# Patient Record
Sex: Female | Born: 2000 | Race: Black or African American | Hispanic: No | Marital: Single | Smoking: Never smoker
Health system: Southern US, Community
[De-identification: ages and names within clinical notes are randomized; demographics above are authoritative.]

---
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
## 2022-04-10 ENCOUNTER — Encounter (HOSPITAL_COMMUNITY): Payer: Self-pay

## 2022-04-10 ENCOUNTER — Ambulatory Visit (INDEPENDENT_AMBULATORY_CARE_PROVIDER_SITE_OTHER): Payer: Medicaid Other

## 2022-04-10 ENCOUNTER — Ambulatory Visit (HOSPITAL_COMMUNITY)
Admission: EM | Admit: 2022-04-10 | Discharge: 2022-04-10 | Disposition: A | Discharge status: Home / Self Care | Payer: Medicaid Other | Attending: Physician Assistant | Admitting: Physician Assistant

## 2022-04-10 DIAGNOSIS — M79671 Pain in right foot: Secondary | ICD-10-CM | POA: Diagnosis not present

## 2022-04-10 DIAGNOSIS — M62838 Other muscle spasm: Secondary | ICD-10-CM

## 2022-04-10 MED ORDER — IBUPROFEN 600 MG PO TABS: 600.0000 mg | ORAL_TABLET | Freq: Four times a day (QID) | ORAL | 0 refills | Status: DC | PRN

## 2022-04-10 MED ORDER — CYCLOBENZAPRINE HCL 5 MG PO TABS: 5.0000 mg | ORAL_TABLET | Freq: Three times a day (TID) | ORAL | 0 refills | Status: DC | PRN
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
## 2022-04-10 NOTE — Discharge Instructions (Addendum)
Your xray was negative for fracture  Recommend ibuprofen as needed for pain Can take flexeril as needed for muscle spasm Recommend ice to affected areas and rest Drink plenty of fluids If you develop visual changes, nausea, vomiting go to the Emergency Department for evaluation.
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
## 2022-11-17 ENCOUNTER — Encounter: Payer: Self-pay | Admitting: Emergency Medicine

## 2022-11-17 ENCOUNTER — Other Ambulatory Visit: Payer: Self-pay

## 2022-11-17 ENCOUNTER — Ambulatory Visit
Admission: EM | Admit: 2022-11-17 | Discharge: 2022-11-17 | Disposition: A | Discharge status: Home / Self Care | Payer: Medicaid Other | Attending: Nurse Practitioner | Admitting: Nurse Practitioner

## 2022-11-17 DIAGNOSIS — N760 Acute vaginitis: Secondary | ICD-10-CM | POA: Diagnosis present

## 2022-11-17 DIAGNOSIS — R6889 Other general symptoms and signs: Secondary | ICD-10-CM | POA: Insufficient documentation

## 2022-11-17 LAB — POCT URINE PREGNANCY: Preg Test, Ur: NEGATIVE

## 2022-11-17 MED ORDER — METRONIDAZOLE 500 MG PO TABS: 500.0000 mg | ORAL_TABLET | Freq: Two times a day (BID) | ORAL | 0 refills | Status: DC

## 2022-11-17 MED ORDER — BENZONATATE 200 MG PO CAPS: 200.0000 mg | ORAL_CAPSULE | Freq: Three times a day (TID) | ORAL | 0 refills | Status: DC | PRN

## 2022-11-17 MED ORDER — ALBUTEROL SULFATE HFA 108 (90 BASE) MCG/ACT IN AERS: 1.0000 | INHALATION_SPRAY | Freq: Four times a day (QID) | RESPIRATORY_TRACT | 0 refills | Status: DC | PRN

## 2022-11-17 NOTE — ED Provider Notes (Signed)
[facility]    CSN: [id number] Arrival date & time: 11/17/22  1251      History   Chief Complaint Chief Complaint  Patient presents with   Cough   Exposure to STD    HPI Jody King is a 22 y.o. female who presents for evaluation of URI symptoms for 3 days. Patient reports associated symptoms of fever, nausea/vomiting, body aches, sore throat, cough/congestion, shortness of breath. Denies diarrhea, ear pain. Patient does not have a hx of asthma.  She reports she occasionally smokes.  States her friend was diagnosed with flu recently.  No recent travel. Pt is vaccinated for COVID. Pt is not vaccinated for flu this season. Pt has taken Tylenol and OTC cough medicine for symptoms. Pt has no other concerns at this time. In addition patient reports 2 to 3 days of a thick fishy smelling discharge.  Denies any itching or dysuria.  No flank pain.  Denies any known STD exposure but would like to get checked.  No other concerns at this time.    Cough Associated symptoms: fever, myalgias, shortness of breath and sore throat   Exposure to STD Associated symptoms include shortness of breath.    History reviewed. No pertinent past medical history.  There are no problems to display for this patient.   History reviewed. No pertinent surgical history.  OB History   No obstetric history on file.      Home Medications    Prior to Admission medications   Medication Sig Start Date End Date Taking? Authorizing Provider  albuterol (VENTOLIN HFA) 108 (90 Base) MCG/ACT inhaler Inhale 1-2 puffs into the lungs every 6 (six) hours as needed for wheezing or shortness of breath. 11/17/22  Yes Melynda Ripple, NP  benzonatate (TESSALON) 200 MG capsule Take 1 capsule (200 mg total) by mouth 3 (three) times daily as needed for cough. 11/17/22  Yes Melynda Ripple, NP  metroNIDAZOLE (FLAGYL) 500 MG tablet Take 1 tablet (500 mg total) by mouth 2 (two) times daily. 11/17/22  Yes Melynda Ripple, NP   cyclobenzaprine (FLEXERIL) 5 MG tablet Take 1 tablet (5 mg total) by mouth 3 (three) times daily as needed for muscle spasms. 04/10/22   Ward, Lenise Arena, PA-C  ibuprofen (ADVIL) 600 MG tablet Take 1 tablet (600 mg total) by mouth every 6 (six) hours as needed. 04/10/22   Ward, Lenise Arena, PA-C    Family History History reviewed. No pertinent family history.  Social History Social History   Tobacco Use   Smoking status: Never   Smokeless tobacco: Never  Substance Use Topics   Alcohol use: No   Drug use: Yes    Types: Marijuana     Allergies   Patient has no known allergies.   Review of Systems Review of Systems  Constitutional:  Positive for fever.  HENT:  Positive for congestion and sore throat.   Respiratory:  Positive for cough and shortness of breath.   Gastrointestinal:  Positive for nausea and vomiting.  Genitourinary:  Positive for vaginal discharge.  Musculoskeletal:  Positive for myalgias.     Physical Exam Triage Vital Signs ED Triage Vitals  Enc Vitals Group     BP 11/17/22 1314 129/89     Pulse Rate 11/17/22 1314 93     Resp 11/17/22 1314 18     Temp 11/17/22 1314 99.6 F (37.6 C)     Temp Source 11/17/22 1314 Oral     SpO2 11/17/22 1314 96 %  Weight --      Height --      Head Circumference --      Peak Flow --      Pain Score 11/17/22 1316 3     Pain Loc --      Pain Edu? --      Excl. in [place]? --    No data found.  Updated Vital Signs BP 129/89 (BP Location: Left Arm)   Pulse 93   Temp 99.6 F (37.6 C) (Oral)   Resp 18   SpO2 96%   Visual Acuity Right Eye Distance:   Left Eye Distance:   Bilateral Distance:    Right Eye Near:   Left Eye Near:    Bilateral Near:     Physical Exam Vitals and nursing note reviewed.  Constitutional:      General: She is not in acute distress.    Appearance: She is well-developed. She is not ill-appearing.  HENT:     Head: Normocephalic and atraumatic.     Right Ear: Tympanic membrane and ear  canal normal.     Left Ear: Tympanic membrane and ear canal normal.     Nose: Congestion present.     Mouth/Throat:     Mouth: Mucous membranes are moist.     Pharynx: Oropharynx is clear. Uvula midline. Posterior oropharyngeal erythema present.     Tonsils: No tonsillar exudate or tonsillar abscesses.  Eyes:     Conjunctiva/sclera: Conjunctivae normal.     Pupils: Pupils are equal, round, and reactive to light.  Cardiovascular:     Rate and Rhythm: Normal rate and regular rhythm.     Heart sounds: Normal heart sounds.  Pulmonary:     Effort: Pulmonary effort is normal.     Breath sounds: Normal breath sounds.  Musculoskeletal:     Cervical back: Normal range of motion and neck supple.  Lymphadenopathy:     Cervical: No cervical adenopathy.  Skin:    General: Skin is warm and dry.  Neurological:     General: No focal deficit present.     Mental Status: She is alert and oriented to person, place, and time.  Psychiatric:        Mood and Affect: Mood normal.        Behavior: Behavior normal.      UC Treatments / Results  Labs (all labs ordered are listed, but only abnormal results are displayed) Labs Reviewed  POCT URINE PREGNANCY  CERVICOVAGINAL ANCILLARY ONLY    EKG   Radiology No results found.  Procedures Procedures (including critical care time)  Medications Ordered in UC Medications - No data to display  Initial Impression / Assessment and Plan / UC Course  I have reviewed the triage vital signs and the nursing notes.  Pertinent labs & imaging results that were available during my care of the patient were reviewed by me and considered in my medical decision making (see chart for details).     Reviewed exam and symptoms with patient.  Discussed symptoms consistent with flu like illness.  Discussed symptomatic treatment given length of her symptoms Declined COVID testing Tessalon as needed Albuterol inhaler as needed STD testing is ordered.  Will start  Flagyl.  Side effect profile reviewed. Rest and fluids Follow-up with PCP 2 to 3 days for recheck ER precautions reviewed and patient verbalized understanding Final Clinical Impressions(s) / UC Diagnoses   Final diagnoses:  Acute vaginitis  Flu-like symptoms     Discharge Instructions  Albuterol inhaler and Tessalon as needed Flagyl twice daily for 7 days.  Do not drink any alcohol while you are taking this medication Rest and fluids Follow-up with your PCP 2 to 3 days for recheck Please go to the emergency room if you have any worsening symptoms   ED Prescriptions     Medication Sig Dispense Auth. Provider   albuterol (VENTOLIN HFA) 108 (90 Base) MCG/ACT inhaler Inhale 1-2 puffs into the lungs every 6 (six) hours as needed for wheezing or shortness of breath. 1 each Radford Pax, NP   benzonatate (TESSALON) 200 MG capsule Take 1 capsule (200 mg total) by mouth 3 (three) times daily as needed for cough. 20 capsule Radford Pax, NP   metroNIDAZOLE (FLAGYL) 500 MG tablet Take 1 tablet (500 mg total) by mouth 2 (two) times daily. 14 tablet Radford Pax, NP      PDMP not reviewed this encounter.   Radford Pax, NP 11/17/22 1351

## 2022-11-17 NOTE — ED Triage Notes (Signed)
Pt here for cough and congestion with nausea x 3 days; pt also requesting STD testing due to foul smell

## 2022-11-17 NOTE — Discharge Instructions (Signed)
Albuterol inhaler and Tessalon as needed Flagyl twice daily for 7 days.  Do not drink any alcohol while you are taking this medication Rest and fluids Follow-up with your PCP 2 to 3 days for recheck Please go to the emergency room if you have any worsening symptoms

## 2022-11-19 LAB — CERVICOVAGINAL ANCILLARY ONLY
Bacterial Vaginitis (gardnerella): POSITIVE — AB
Candida Glabrata: NEGATIVE
Candida Vaginitis: NEGATIVE
Chlamydia: NEGATIVE
Comment: NEGATIVE
Comment: NEGATIVE
Comment: NEGATIVE
Comment: NEGATIVE
Comment: NEGATIVE
Comment: NORMAL
Neisseria Gonorrhea: POSITIVE — AB
Trichomonas: NEGATIVE

## 2022-11-20 ENCOUNTER — Telehealth (HOSPITAL_COMMUNITY): Payer: Self-pay | Admitting: Emergency Medicine

## 2022-11-20 NOTE — Telephone Encounter (Signed)
Patient went home on MEtronidazole Per protocol, will also need treatment with IM Rocephin 500mg  for positive Gonrorhea

## 2022-12-11 ENCOUNTER — Ambulatory Visit
Admission: EM | Admit: 2022-12-11 | Discharge: 2022-12-11 | Disposition: A | Discharge status: Home / Self Care | Payer: Medicaid Other | Attending: Physician Assistant | Admitting: Physician Assistant

## 2022-12-11 ENCOUNTER — Telehealth: Payer: Self-pay | Admitting: *Deleted

## 2022-12-11 ENCOUNTER — Other Ambulatory Visit: Payer: Self-pay

## 2022-12-11 ENCOUNTER — Encounter: Payer: Self-pay | Admitting: *Deleted

## 2022-12-11 DIAGNOSIS — Z113 Encounter for screening for infections with a predominantly sexual mode of transmission: Secondary | ICD-10-CM | POA: Diagnosis not present

## 2022-12-11 MED ORDER — CEFTRIAXONE SODIUM 1 G IJ SOLR
1.0000 g | Freq: Once | INTRAMUSCULAR | Status: AC
  Administered 2022-12-11: 1 g via INTRAMUSCULAR

## 2022-12-11 NOTE — ED Triage Notes (Signed)
Pt received a call to come for treatment of STD.

## 2022-12-11 NOTE — ED Notes (Signed)
PT anxious unable to give IM rocephin

## 2023-01-23 ENCOUNTER — Ambulatory Visit
Admission: EM | Admit: 2023-01-23 | Discharge: 2023-01-23 | Disposition: A | Discharge status: Home / Self Care | Payer: Medicaid Other | Attending: Nurse Practitioner | Admitting: Nurse Practitioner

## 2023-01-23 DIAGNOSIS — B349 Viral infection, unspecified: Secondary | ICD-10-CM

## 2023-01-23 LAB — POCT INFLUENZA A/B
Influenza A, POC: NEGATIVE
Influenza B, POC: NEGATIVE

## 2023-01-23 MED ORDER — ALBUTEROL SULFATE HFA 108 (90 BASE) MCG/ACT IN AERS: 1.0000 | INHALATION_SPRAY | Freq: Four times a day (QID) | RESPIRATORY_TRACT | 0 refills | Status: DC | PRN

## 2023-01-23 MED ORDER — PROMETHAZINE-DM 6.25-15 MG/5ML PO SYRP: 5.0000 mL | ORAL_SOLUTION | Freq: Four times a day (QID) | ORAL | 0 refills | Status: DC | PRN

## 2023-01-23 MED ORDER — ONDANSETRON 4 MG PO TBDP: 4.0000 mg | ORAL_TABLET | Freq: Three times a day (TID) | ORAL | 0 refills | Status: DC | PRN

## 2023-01-23 NOTE — Discharge Instructions (Signed)
Albuterol as needed Promethazine as needed for cough.  This medication can make you drowsy.  Do not drink alcohol or drive on this medication Zofran as needed for nausea/vomiting Rest and fluids Follow-up with your PCP if your symptoms do not improve Please go to the ER for any worsening symptoms

## 2023-01-23 NOTE — ED Provider Notes (Signed)
[facility]    CSN: [id number] Arrival date & time: 01/23/23  1942      History   Chief Complaint Chief Complaint  Patient presents with   Nasal Congestion   Shortness of Breath   Sore Throat    HPI Jody King is a 22 y.o. female  presents for evaluation of URI symptoms for 5 days. Patient reports associated symptoms of cough, congestion, shortness of breath,sore throat and nausea and vomiting. Denies diarrhea, fevers. Patient does not have a hx of asthma or smoking. No known sick contacts.  Pt has taken Mucinex OTC for symptoms. Pt has no other concerns at this time.    Shortness of Breath Associated symptoms: cough, sore throat and vomiting   Sore Throat Associated symptoms include shortness of breath.    History reviewed. No pertinent past medical history.  There are no problems to display for this patient.   History reviewed. No pertinent surgical history.  OB History   No obstetric history on file.      Home Medications    Prior to Admission medications   Medication Sig Start Date End Date Taking? Authorizing Provider  albuterol (VENTOLIN HFA) 108 (90 Base) MCG/ACT inhaler Inhale 1-2 puffs into the lungs every 6 (six) hours as needed for wheezing or shortness of breath. 01/23/23  Yes Melynda Ripple, NP  ondansetron (ZOFRAN-ODT) 4 MG disintegrating tablet Take 1 tablet (4 mg total) by mouth every 8 (eight) hours as needed for nausea or vomiting. 01/23/23  Yes Melynda Ripple, NP  promethazine-dextromethorphan (PROMETHAZINE-DM) 6.25-15 MG/5ML syrup Take 5 mLs by mouth 4 (four) times daily as needed for cough. 01/23/23  Yes Melynda Ripple, NP  benzonatate (TESSALON) 200 MG capsule Take 1 capsule (200 mg total) by mouth 3 (three) times daily as needed for cough. 11/17/22   Melynda Ripple, NP  cyclobenzaprine (FLEXERIL) 5 MG tablet Take 1 tablet (5 mg total) by mouth 3 (three) times daily as needed for muscle spasms. 04/10/22   Ward, Lenise Arena, PA-C  ibuprofen  (ADVIL) 600 MG tablet Take 1 tablet (600 mg total) by mouth every 6 (six) hours as needed. 04/10/22   Ward, Lenise Arena, PA-C  metroNIDAZOLE (FLAGYL) 500 MG tablet Take 1 tablet (500 mg total) by mouth 2 (two) times daily. 11/17/22   Melynda Ripple, NP    Family History History reviewed. No pertinent family history.  Social History Social History   Tobacco Use   Smoking status: Never   Smokeless tobacco: Never  Substance Use Topics   Alcohol use: No   Drug use: Yes    Types: Marijuana     Allergies   Patient has no known allergies.   Review of Systems Review of Systems  HENT:  Positive for congestion and sore throat.   Respiratory:  Positive for cough and shortness of breath.   Gastrointestinal:  Positive for nausea and vomiting.     Physical Exam Triage Vital Signs ED Triage Vitals  Enc Vitals Group     BP 01/23/23 1951 125/84     Pulse Rate 01/23/23 1951 80     Resp 01/23/23 1951 18     Temp 01/23/23 1951 98.8 F (37.1 C)     Temp Source 01/23/23 1951 Oral     SpO2 01/23/23 1951 98 %     Weight --      Height --      Head Circumference --      Peak Flow --  Pain Score 01/23/23 1950 5     Pain Loc --      Pain Edu? --      Excl. in [place]? --    No data found.  Updated Vital Signs BP 125/84 (BP Location: Left Arm)   Pulse 80   Temp 98.8 F (37.1 C) (Oral)   Resp 18   LMP 01/02/2023 (Approximate)   SpO2 98%   Visual Acuity Right Eye Distance:   Left Eye Distance:   Bilateral Distance:    Right Eye Near:   Left Eye Near:    Bilateral Near:     Physical Exam Vitals and nursing note reviewed.  Constitutional:      General: She is not in acute distress.    Appearance: She is well-developed. She is not ill-appearing.  HENT:     Head: Normocephalic and atraumatic.     Right Ear: Tympanic membrane and ear canal normal.     Left Ear: Tympanic membrane and ear canal normal.     Nose: Congestion present.     Mouth/Throat:     Mouth: Mucous membranes  are moist.     Pharynx: Oropharynx is clear. Uvula midline. Posterior oropharyngeal erythema present.     Tonsils: No tonsillar exudate or tonsillar abscesses.  Eyes:     Conjunctiva/sclera: Conjunctivae normal.     Pupils: Pupils are equal, round, and reactive to light.  Cardiovascular:     Rate and Rhythm: Normal rate and regular rhythm.     Heart sounds: Normal heart sounds.  Pulmonary:     Effort: Pulmonary effort is normal.     Breath sounds: Normal breath sounds.  Musculoskeletal:     Cervical back: Normal range of motion and neck supple.  Lymphadenopathy:     Cervical: No cervical adenopathy.  Skin:    General: Skin is warm and dry.  Neurological:     General: No focal deficit present.     Mental Status: She is alert and oriented to person, place, and time.  Psychiatric:        Mood and Affect: Mood normal.        Behavior: Behavior normal.      UC Treatments / Results  Labs (all labs ordered are listed, but only abnormal results are displayed) Labs Reviewed  POCT INFLUENZA A/B    EKG   Radiology No results found.  Procedures Procedures (including critical care time)  Medications Ordered in UC Medications - No data to display  Initial Impression / Assessment and Plan / UC Course  I have reviewed the triage vital signs and the nursing notes.  Pertinent labs & imaging results that were available during my care of the patient were reviewed by me and considered in my medical decision making (see chart for details).     Reviewed exam and symptoms with patient.  No red flags. Negative rapid flu, discussed viral illness Promethazine DM for cough Albuterol inhaler as needed Zofran as needed for nausea/vomiting Rest and fluids PCP follow-up if symptoms do not precautions reviewed and patient verbalized understanding Final Clinical Impressions(s) / UC Diagnoses   Final diagnoses:  Viral illness     Discharge Instructions      Albuterol as  needed Promethazine as needed for cough.  This medication can make you drowsy.  Do not drink alcohol or drive on this medication Zofran as needed for nausea/vomiting Rest and fluids Follow-up with your PCP if your symptoms do not improve Please go to the ER  for any worsening symptoms     ED Prescriptions     Medication Sig Dispense Auth. Provider   albuterol (VENTOLIN HFA) 108 (90 Base) MCG/ACT inhaler Inhale 1-2 puffs into the lungs every 6 (six) hours as needed for wheezing or shortness of breath. 1 each Melynda Ripple, NP   promethazine-dextromethorphan (PROMETHAZINE-DM) 6.25-15 MG/5ML syrup Take 5 mLs by mouth 4 (four) times daily as needed for cough. 118 mL Melynda Ripple, NP   ondansetron (ZOFRAN-ODT) 4 MG disintegrating tablet Take 1 tablet (4 mg total) by mouth every 8 (eight) hours as needed for nausea or vomiting. 20 tablet Melynda Ripple, NP      PDMP not reviewed this encounter.   Melynda Ripple, NP 01/23/23 2011

## 2023-01-23 NOTE — ED Triage Notes (Signed)
Pt reports having SHOB, scratchy throat, runny nose, and nausea/ vomiting.   Started: 5 days ago   Home interventions:  OTC medication

## 2023-02-19 ENCOUNTER — Ambulatory Visit (HOSPITAL_COMMUNITY)
Admission: EM | Admit: 2023-02-19 | Discharge: 2023-02-19 | Disposition: A | Discharge status: Home / Self Care | Payer: Medicaid Other | Attending: Physician Assistant | Admitting: Physician Assistant

## 2023-02-19 ENCOUNTER — Encounter (HOSPITAL_COMMUNITY): Payer: Self-pay

## 2023-02-19 DIAGNOSIS — J069 Acute upper respiratory infection, unspecified: Secondary | ICD-10-CM | POA: Diagnosis not present

## 2023-02-19 DIAGNOSIS — M62838 Other muscle spasm: Secondary | ICD-10-CM | POA: Diagnosis not present

## 2023-02-19 DIAGNOSIS — B349 Viral infection, unspecified: Secondary | ICD-10-CM

## 2023-02-19 MED ORDER — PROMETHAZINE-DM 6.25-15 MG/5ML PO SYRP: 5.0000 mL | ORAL_SOLUTION | Freq: Four times a day (QID) | ORAL | 0 refills | Status: DC | PRN

## 2023-02-19 MED ORDER — CYCLOBENZAPRINE HCL 5 MG PO TABS: 5.0000 mg | ORAL_TABLET | Freq: Three times a day (TID) | ORAL | 0 refills | Status: DC | PRN

## 2023-02-19 MED ORDER — ALBUTEROL SULFATE HFA 108 (90 BASE) MCG/ACT IN AERS: 1.0000 | INHALATION_SPRAY | Freq: Four times a day (QID) | RESPIRATORY_TRACT | 0 refills | Status: DC | PRN
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
## 2023-02-19 NOTE — Discharge Instructions (Signed)
Recommend Mucinex and Flonase Take cough syrup as needed Use inhaler as needed for wheezing or shortness of breath  Can take muscle relaxer as needed for muscle spasm
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
## 2023-02-25 ENCOUNTER — Ambulatory Visit
Admission: EM | Admit: 2023-02-25 | Discharge: 2023-02-25 | Disposition: A | Discharge status: Home / Self Care | Payer: Medicaid Other | Attending: Nurse Practitioner | Admitting: Nurse Practitioner

## 2023-02-25 DIAGNOSIS — J209 Acute bronchitis, unspecified: Secondary | ICD-10-CM | POA: Diagnosis not present

## 2023-02-25 DIAGNOSIS — G43809 Other migraine, not intractable, without status migrainosus: Secondary | ICD-10-CM

## 2023-02-25 MED ORDER — BENZONATATE 200 MG PO CAPS: 200.0000 mg | ORAL_CAPSULE | Freq: Three times a day (TID) | ORAL | 0 refills | Status: DC | PRN

## 2023-02-25 MED ORDER — AZITHROMYCIN 250 MG PO TABS: 250.0000 mg | ORAL_TABLET | Freq: Every day | ORAL | 0 refills | Status: DC

## 2023-02-25 MED ORDER — IBUPROFEN 800 MG PO TABS: 800.0000 mg | ORAL_TABLET | Freq: Three times a day (TID) | ORAL | 0 refills | Status: AC | PRN

## 2023-02-25 MED ORDER — ALBUTEROL SULFATE HFA 108 (90 BASE) MCG/ACT IN AERS: 1.0000 | INHALATION_SPRAY | Freq: Four times a day (QID) | RESPIRATORY_TRACT | 0 refills | Status: AC | PRN

## 2023-02-25 NOTE — ED Triage Notes (Signed)
No answer by phone or in waiting area.

## 2023-02-25 NOTE — ED Triage Notes (Signed)
Pt c/o productive cough, nasal congestion, sore throat, and headache since last Thursday. States taking OTC meds with no relief. States took advil today.

## 2023-02-25 NOTE — Discharge Instructions (Signed)
Albuterol inhaler as needed.  Start Zithromax as prescribed Tessalon as needed for cough Ibuprofen 800 mg tablets as needed for your migraines Rest and fluids Please establish with a PCP for recheck as well as further treatment options of your migraines Please go to the ER for any worsening symptoms I hope you feel better soon!

## 2023-02-25 NOTE — ED Provider Notes (Signed)
[facility]    CSN: [id number] Arrival date & time: 02/25/23  1709      History   Chief Complaint Chief Complaint  Patient presents with   Cough   Headache    HPI Jody King is a 22 y.o. female  presents for evaluation of URI symptoms for 10 days. Patient reports associated symptoms of cough, congestion sore throat, and migraine headache.  Had a couple episodes of posttussive vomiting.  Denies diarrhea, fevers, ear pain, body aches, shortness of breath.  Has history of migraines.  Does not take any rescue medications.  Has been taking ibuprofen which temporarily helps.  Denies he is aware 6 of her life.  Patient does not have a hx of asthma.  She does smoke.  No known sick contacts.  Pt has taken the Mucinex and ibuprofen OTC for symptoms. Pt has no other concerns at this time.    Cough Associated symptoms: headaches and sore throat   Headache Associated symptoms: congestion, cough and sore throat     History reviewed. No pertinent past medical history.  There are no problems to display for this patient.   History reviewed. No pertinent surgical history.  OB History   No obstetric history on file.      Home Medications    Prior to Admission medications   Medication Sig Start Date End Date Taking? Authorizing Provider  albuterol (VENTOLIN HFA) 108 (90 Base) MCG/ACT inhaler Inhale 1-2 puffs into the lungs every 6 (six) hours as needed for wheezing or shortness of breath. 02/25/23  Yes Radford Pax, NP  azithromycin (ZITHROMAX) 250 MG tablet Take 1 tablet (250 mg total) by mouth daily. Take first 2 tablets together, then 1 every day until finished. 02/25/23  Yes Radford Pax, NP  benzonatate (TESSALON) 200 MG capsule Take 1 capsule (200 mg total) by mouth 3 (three) times daily as needed for cough. 02/25/23  Yes Radford Pax, NP  ibuprofen (ADVIL) 800 MG tablet Take 1 tablet (800 mg total) by mouth every 8 (eight) hours as needed for headache. 02/25/23  Yes  Radford Pax, NP  promethazine-dextromethorphan (PROMETHAZINE-DM) 6.25-15 MG/5ML syrup Take 5 mLs by mouth 4 (four) times daily as needed for cough. 02/19/23   Ward, Tylene Fantasia, PA-C    Family History History reviewed. No pertinent family history.  Social History Social History   Tobacco Use   Smoking status: Never   Smokeless tobacco: Never  Substance Use Topics   Alcohol use: No   Drug use: Yes    Types: Marijuana     Allergies   Patient has no known allergies.   Review of Systems Review of Systems  HENT:  Positive for congestion and sore throat.   Respiratory:  Positive for cough.   Gastrointestinal:        Posttussive vomiting  Neurological:  Positive for headaches.     Physical Exam Triage Vital Signs ED Triage Vitals  Enc Vitals Group     BP 02/25/23 1848 122/65     Pulse Rate 02/25/23 1848 69     Resp 02/25/23 1848 18     Temp 02/25/23 1848 99.2 F (37.3 C)     Temp Source 02/25/23 1848 Oral     SpO2 02/25/23 1848 98 %     Weight --      Height --      Head Circumference --      Peak Flow --      Pain Score  02/25/23 1842 7     Pain Loc --      Pain Edu? --      Excl. in GC? --    No data found.  Updated Vital Signs BP 122/65 (BP Location: Left Arm)   Pulse 69   Temp 99.2 F (37.3 C) (Oral)   Resp 18   LMP 01/25/2023 (Approximate)   SpO2 98%   Visual Acuity Right Eye Distance:   Left Eye Distance:   Bilateral Distance:    Right Eye Near:   Left Eye Near:    Bilateral Near:     Physical Exam Vitals and nursing note reviewed.  Constitutional:      General: She is not in acute distress.    Appearance: She is well-developed. She is not ill-appearing.  HENT:     Head: Normocephalic and atraumatic.     Right Ear: Tympanic membrane and ear canal normal.     Left Ear: Tympanic membrane and ear canal normal.     Nose: Congestion present.     Mouth/Throat:     Mouth: Mucous membranes are moist.     Pharynx: Oropharynx is clear. Uvula  midline. No oropharyngeal exudate or posterior oropharyngeal erythema.     Tonsils: No tonsillar exudate or tonsillar abscesses.  Eyes:     Conjunctiva/sclera: Conjunctivae normal.     Pupils: Pupils are equal, round, and reactive to light.  Cardiovascular:     Rate and Rhythm: Normal rate and regular rhythm.     Heart sounds: Normal heart sounds.  Pulmonary:     Effort: Pulmonary effort is normal.     Breath sounds: Normal breath sounds.  Musculoskeletal:     Cervical back: Normal range of motion and neck supple.  Lymphadenopathy:     Cervical: No cervical adenopathy.  Skin:    General: Skin is warm and dry.  Neurological:     General: No focal deficit present.     Mental Status: She is alert and oriented to person, place, and time.     GCS: GCS eye subscore is 4. GCS verbal subscore is 5. GCS motor subscore is 6.     Cranial Nerves: No facial asymmetry.     Motor: No weakness.     Coordination: Romberg sign negative. Finger-Nose-Finger Test normal.  Psychiatric:        Mood and Affect: Mood normal.        Behavior: Behavior normal.      UC Treatments / Results  Labs (all labs ordered are listed, but only abnormal results are displayed) Labs Reviewed - No data to display  EKG   Radiology No results found.  Procedures Procedures (including critical care time)  Medications Ordered in UC Medications - No data to display  Initial Impression / Assessment and Plan / UC Course  I have reviewed the triage vital signs and the nursing notes.  Pertinent labs & imaging results that were available during my care of the patient were reviewed by me and considered in my medical decision making (see chart for details).     Reviewed exam and symptoms with patient.  No red flags.  Patient is well-appearing and in no acute distress Will start Zithromax given length of symptoms Tessalon as needed for cough Albuterol inhaler as needed Ibuprofen as needed for migraines. Advised  PCP for recheck as well as for migraine management and overall wellness ER precautions reviewed and patient verbalized understanding Final Clinical Impressions(s) / UC Diagnoses   Final  diagnoses:  Acute bronchitis, unspecified organism  Other migraine without status migrainosus, not intractable     Discharge Instructions      Albuterol inhaler as needed.  Start Zithromax as prescribed Tessalon as needed for cough Ibuprofen 800 mg tablets as needed for your migraines Rest and fluids Please establish with a PCP for recheck as well as further treatment options of your migraines Please go to the ER for any worsening symptoms I hope you feel better soon!    ED Prescriptions     Medication Sig Dispense Auth. Provider   albuterol (VENTOLIN HFA) 108 (90 Base) MCG/ACT inhaler Inhale 1-2 puffs into the lungs every 6 (six) hours as needed for wheezing or shortness of breath. 1 each Radford Pax, NP   azithromycin (ZITHROMAX) 250 MG tablet Take 1 tablet (250 mg total) by mouth daily. Take first 2 tablets together, then 1 every day until finished. 6 tablet Radford Pax, NP   benzonatate (TESSALON) 200 MG capsule Take 1 capsule (200 mg total) by mouth 3 (three) times daily as needed for cough. 20 capsule Radford Pax, NP   ibuprofen (ADVIL) 800 MG tablet Take 1 tablet (800 mg total) by mouth every 8 (eight) hours as needed for headache. 21 tablet Radford Pax, NP      PDMP not reviewed this encounter.   Radford Pax, NP 02/25/23 1900

## 2023-04-07 ENCOUNTER — Ambulatory Visit (HOSPITAL_COMMUNITY)
Admission: EM | Admit: 2023-04-07 | Discharge: 2023-04-07 | Disposition: A | Discharge status: Home / Self Care | Payer: Medicaid Other | Attending: Emergency Medicine | Admitting: Emergency Medicine

## 2023-04-07 ENCOUNTER — Encounter (HOSPITAL_COMMUNITY): Payer: Self-pay | Admitting: Emergency Medicine

## 2023-04-07 DIAGNOSIS — J209 Acute bronchitis, unspecified: Secondary | ICD-10-CM | POA: Insufficient documentation

## 2023-04-07 DIAGNOSIS — Z202 Contact with and (suspected) exposure to infections with a predominantly sexual mode of transmission: Secondary | ICD-10-CM | POA: Diagnosis not present

## 2023-04-07 DIAGNOSIS — R051 Acute cough: Secondary | ICD-10-CM | POA: Insufficient documentation

## 2023-04-07 DIAGNOSIS — Z113 Encounter for screening for infections with a predominantly sexual mode of transmission: Secondary | ICD-10-CM | POA: Diagnosis not present

## 2023-04-07 MED ORDER — PROMETHAZINE-DM 6.25-15 MG/5ML PO SYRP: 5.0000 mL | ORAL_SOLUTION | Freq: Four times a day (QID) | ORAL | 0 refills | Status: DC | PRN

## 2023-04-07 MED ORDER — DOXYCYCLINE HYCLATE 100 MG PO CAPS: 100.0000 mg | ORAL_CAPSULE | Freq: Two times a day (BID) | ORAL | 0 refills | Status: DC

## 2023-04-07 MED ORDER — BENZONATATE 200 MG PO CAPS: 200.0000 mg | ORAL_CAPSULE | Freq: Three times a day (TID) | ORAL | 0 refills | Status: DC | PRN

## 2023-04-07 NOTE — ED Provider Notes (Signed)
[facility]    CSN: [id number] Arrival date & time: 04/07/23  1744      History   Chief Complaint Chief Complaint  Patient presents with   Cough   Exposure to STD    HPI Jody King is a 22 y.o. female.   productive cough worse in the morning, sob and wheezing only in the mornin with coughing, g, for 5 days, chest and back soreness, relates to coughing, no sick eating, mucinex, no repsiratory history, seasoanl allergies, abntihistmain, no constisnt,     Expoure to chlymadia, std, lmp 03/31/2023 only partner, someitmes condomn sue,   History reviewed. No pertinent past medical history.  There are no problems to display for this patient.   History reviewed. No pertinent surgical history.  OB History   No obstetric history on file.      Home Medications    Prior to Admission medications   Medication Sig Start Date End Date Taking? Authorizing Provider  albuterol (VENTOLIN HFA) 108 (90 Base) MCG/ACT inhaler Inhale 1-2 puffs into the lungs every 6 (six) hours as needed for wheezing or shortness of breath. 02/25/23   Radford Pax, NP  azithromycin (ZITHROMAX) 250 MG tablet Take 1 tablet (250 mg total) by mouth daily. Take first 2 tablets together, then 1 every day until finished. 02/25/23   Radford Pax, NP  benzonatate (TESSALON) 200 MG capsule Take 1 capsule (200 mg total) by mouth 3 (three) times daily as needed for cough. 02/25/23   Radford Pax, NP  ibuprofen (ADVIL) 800 MG tablet Take 1 tablet (800 mg total) by mouth every 8 (eight) hours as needed for headache. 02/25/23   Radford Pax, NP  promethazine-dextromethorphan (PROMETHAZINE-DM) 6.25-15 MG/5ML syrup Take 5 mLs by mouth 4 (four) times daily as needed for cough. 02/19/23   Ward, Tylene Fantasia, PA-C    Family History No family history on file.  Social History Social History   Tobacco Use   Smoking status: Never   Smokeless tobacco: Never  Substance Use Topics   Alcohol use: No   Drug use: Yes     Types: Marijuana     Allergies   Patient has no known allergies.   Review of Systems Review of Systems  Constitutional: Negative.   HENT:  Positive for congestion and rhinorrhea.   Respiratory:  Positive for cough, shortness of breath and wheezing. Negative for apnea, choking, chest tightness and stridor.   Cardiovascular: Negative.      Physical Exam Triage Vital Signs ED Triage Vitals  Enc Vitals Group     BP 04/07/23 1812 103/69     Pulse Rate 04/07/23 1812 71     Resp 04/07/23 1812 16     Temp 04/07/23 1812 98.7 F (37.1 C)     Temp Source 04/07/23 1812 Oral     SpO2 04/07/23 1812 97 %     Weight --      Height --      Head Circumference --      Peak Flow --      Pain Score 04/07/23 1810 0     Pain Loc --      Pain Edu? --      Excl. in GC? --    No data found.  Updated Vital Signs BP 103/69 (BP Location: Left Arm)   Pulse 71   Temp 98.7 F (37.1 C) (Oral)   Resp 16   LMP 03/31/2023   SpO2 97%   Visual  Acuity Right Eye Distance:   Left Eye Distance:   Bilateral Distance:    Right Eye Near:   Left Eye Near:    Bilateral Near:     Physical Exam Constitutional:      Appearance: Normal appearance.  HENT:     Right Ear: Tympanic membrane, ear canal and external ear normal.     Left Ear: Tympanic membrane, ear canal and external ear normal.     Nose: Congestion present. No rhinorrhea.     Mouth/Throat:     Pharynx: No posterior oropharyngeal erythema.  Eyes:     Extraocular Movements: Extraocular movements intact.  Cardiovascular:     Rate and Rhythm: Normal rate and regular rhythm.     Pulses: Normal pulses.     Heart sounds: Normal heart sounds.  Pulmonary:     Effort: Pulmonary effort is normal.     Breath sounds: Normal breath sounds.  Skin:    General: Skin is warm and dry.  Neurological:     Mental Status: She is alert and oriented to person, place, and time. Mental status is at baseline.      UC Treatments / Results  Labs (all  labs ordered are listed, but only abnormal results are displayed) Labs Reviewed  CYTOLOGY, (ORAL, ANAL, URETHRAL) ANCILLARY ONLY    EKG   Radiology No results found.  Procedures Procedures (including critical care time)  Medications Ordered in UC Medications - No data to display  Initial Impression / Assessment and Plan / UC Course  I have reviewed the triage vital signs and the nursing notes.  Pertinent labs & imaging results that were available during my care of the patient were reviewed by me and considered in my medical decision making (see chart for details).   Final Clinical Impressions(s) / UC Diagnoses   Final diagnoses:  Acute cough  Routine screening for STI (sexually transmitted infection)  Exposure to STD     Discharge Instructions      Your symptoms today are most likely being caused by a virus and should steadily improve in time   You may use cough syrup every 6 hours as needed for additional comfort, be mindful of this may make you feel sleepy    You can take Tylenol and/or Ibuprofen as needed for fever reduction and pain relief.   For cough: honey 1/2 to 1 teaspoon (you can dilute the honey in water or another fluid).  You can also use guaifenesin and dextromethorphan for cough. You can use a humidifier for chest congestion and cough.  If you don't have a humidifier, you can sit in the bathroom with the hot shower running.      For sore throat: try warm salt water gargles, cepacol lozenges, throat spray, warm tea or water with lemon/honey, popsicles or ice, or OTC cold relief medicine for throat discomfort.   For congestion: take a daily anti-histamine like Zyrtec, Claritin, and a oral decongestant, such as pseudoephedrine.  You can also use Flonase 1-2 sprays in each nostril daily.   It is important to stay hydrated: drink plenty of fluids (water, gatorade/powerade/pedialyte, juices, or teas) to keep your throat moisturized and help further relieve  irritation/discomfort.  Today you are being treated prophylactically for chlamydia  Take doxycycline every morning and every evening for 7 days, this medicine also provides coverage for any germs in the respiratory system and may help with your cough   Labs pending 2-3 days, you will be contacted if positive for  any sti and treatment will be sent to the pharmacy, you will have to return to the clinic if positive for gonorrhea to receive treatment   Please refrain from having sex until labs results, if positive please refrain from having sex until treatment complete and symptoms resolve   If positive for, Chlamydia  gonorrhea or trichomoniasis please notify partner or partners so they may tested as well  Moving forward, it is recommended you use some form of protection against the transmission of sti infections  such as condoms or dental dams with each sexual encounter     ED Prescriptions   None    PDMP not reviewed this encounter.

## 2023-04-07 NOTE — Discharge Instructions (Addendum)
Your symptoms today are most likely being caused by a virus as allergies  The antibiotic that you were given to treat chlamydia will also provide coverage for the airways  You may use cough syrup every 6 hours as needed for additional comfort, be mindful of this may make you feel sleepy  May use Tessalon pill every 8 hours as needed to help with coughing    You can take Tylenol and/or Ibuprofen as needed for fever reduction and pain relief.   For cough: honey 1/2 to 1 teaspoon (you can dilute the honey in water or another fluid).  You can also use guaifenesin and dextromethorphan for cough. You can use a humidifier for chest congestion and cough.  If you don't have a humidifier, you can sit in the bathroom with the hot shower running.      For sore throat: try warm salt water gargles, cepacol lozenges, throat spray, warm tea or water with lemon/honey, popsicles or ice, or OTC cold relief medicine for throat discomfort.   For congestion: take a daily anti-histamine like Zyrtec, Claritin, and a oral decongestant, such as pseudoephedrine.  You can also use Flonase 1-2 sprays in each nostril daily.   It is important to stay hydrated: drink plenty of fluids (water, gatorade/powerade/pedialyte, juices, or teas) to keep your throat moisturized and help further relieve irritation/discomfort.  Today you are being treated prophylactically for chlamydia  Take doxycycline every morning and every evening for 7 days, this medicine also provides coverage for any germs in the respiratory system and may help with your cough   Labs pending 2-3 days, you will be contacted if positive for any sti and treatment will be sent to the pharmacy, you will have to return to the clinic if positive for gonorrhea to receive treatment   Please refrain from having sex until labs results, if positive please refrain from having sex until treatment complete and symptoms resolve   If positive for, Chlamydia  gonorrhea or  trichomoniasis please notify partner or partners so they may tested as well  Moving forward, it is recommended you use some form of protection against the transmission of sti infections  such as condoms or dental dams with each sexual encounter

## 2023-04-07 NOTE — ED Triage Notes (Signed)
Had cough at night for about 5 days. Reports that in mornings coughs up phlegm. Took Mucinex.  Reports exposure to chlamydia and requesting STD testing.

## 2023-04-09 LAB — CYTOLOGY, (ORAL, ANAL, URETHRAL) ANCILLARY ONLY
Chlamydia: NEGATIVE
Comment: NEGATIVE
Comment: NEGATIVE
Comment: NORMAL
Neisseria Gonorrhea: NEGATIVE
Trichomonas: NEGATIVE

## 2023-04-27 ENCOUNTER — Other Ambulatory Visit: Payer: Self-pay

## 2023-04-27 ENCOUNTER — Emergency Department (HOSPITAL_COMMUNITY)
Admission: EM | Admit: 2023-04-27 | Discharge: 2023-04-28 | Disposition: A | Discharge status: Home / Self Care | Payer: Medicaid Other | Attending: Emergency Medicine | Admitting: Emergency Medicine

## 2023-04-27 ENCOUNTER — Encounter (HOSPITAL_COMMUNITY): Payer: Self-pay | Admitting: Emergency Medicine

## 2023-04-27 DIAGNOSIS — W540XXA Bitten by dog, initial encounter: Secondary | ICD-10-CM | POA: Insufficient documentation

## 2023-04-27 DIAGNOSIS — S61511A Laceration without foreign body of right wrist, initial encounter: Secondary | ICD-10-CM | POA: Insufficient documentation

## 2023-04-27 DIAGNOSIS — Y9301 Activity, walking, marching and hiking: Secondary | ICD-10-CM | POA: Diagnosis not present

## 2023-04-27 DIAGNOSIS — Y92039 Unspecified place in apartment as the place of occurrence of the external cause: Secondary | ICD-10-CM | POA: Diagnosis not present

## 2023-04-27 MED ORDER — TETANUS-DIPHTH-ACELL PERTUSSIS 5-2.5-18.5 LF-MCG/0.5 IM SUSY
0.5000 mL | PREFILLED_SYRINGE | Freq: Once | INTRAMUSCULAR | Status: DC
  Filled 2023-04-27: qty 0.5

## 2023-04-27 MED ORDER — OXYCODONE-ACETAMINOPHEN 5-325 MG PO TABS
2.0000 | ORAL_TABLET | Freq: Once | ORAL | Status: AC
  Administered 2023-04-28: 2 via ORAL
  Filled 2023-04-27: qty 2

## 2023-04-27 MED ORDER — BUPIVACAINE HCL (PF) 0.5 % IJ SOLN
10.0000 mL | Freq: Once | INTRAMUSCULAR | Status: AC
  Administered 2023-04-28: 10 mL
  Filled 2023-04-27: qty 10

## 2023-04-27 NOTE — ED Triage Notes (Signed)
Pt in with dog bite to R inner wrist, multiple teeth marks and inner tissue exposed. Bleeding controlled with gauze. Pt states it was a family friend's pitbull that ran towards her and attacked her, unknown vaccination status of dog. Unknown last tetanus, full ROM to wrist, limited ROM to R 4th and 5th fingers

## 2023-04-28 ENCOUNTER — Other Ambulatory Visit (HOSPITAL_COMMUNITY): Payer: Medicaid Other

## 2023-04-28 ENCOUNTER — Emergency Department (HOSPITAL_COMMUNITY): Payer: Medicaid Other

## 2023-04-28 MED ORDER — AMOXICILLIN-POT CLAVULANATE 875-125 MG PO TABS: 1.0000 | ORAL_TABLET | Freq: Two times a day (BID) | ORAL | 0 refills | Status: DC

## 2023-04-28 NOTE — ED Notes (Signed)
Wound dressing applied.

## 2023-04-28 NOTE — Discharge Instructions (Addendum)
Keep the area clean with warm soap and water dab dry and cover with a thin layer bacitracin ointment or Vaseline.  Keep covered prevent it from getting dirty.  Clean at least once per day and you may do so twice daily.  Follow-up with the hand surgeon I have given you their information.  He did have a laceration/puncture that penetrated down to the tendon in your forearm however it does not appear that it was injured.  I have written you prescription for an antibiotic called Augmentin take for the entire course as this will help prevent infections related to the dog bite.  Please use Tylenol or ibuprofen for pain.  You may use 600 mg ibuprofen every 6 hours or 1000 mg of Tylenol every 6 hours.  You may choose to alternate between the 2.  This would be most effective.  Not to exceed 4 g of Tylenol within 24 hours.  Not to exceed 3200 mg ibuprofen 24 hours.

## 2023-04-28 NOTE — ED Provider Notes (Addendum)
[facility]GENCY DEPARTMENT AT [facility] Provider Note   CSN: [id number] Arrival date & time: 04/27/23  2313     History  Chief Complaint  Patient presents with   Animal Bite    Kabao Glazer is a 22 y.o. female.   Animal Bite  Patient is a 22 year old female with no pertinent past medical history presenting emergency room today with complaints of dog bite to wrist/forearm.  Patient was walking to her mother's apartment complex when a dog that lives in her mother's complex he was wearing a collar and is known by the mother ran out and bit the daughter on the wrist. Daughter states she had not med this dog before and it seems it'd escaped from it's owners house.   Bleeding controlled w pressure. She has some paresthesias but can feel touch easily in finger tips. She has pain with movement of her 4th and 5th finger.      Home Medications Prior to Admission medications   Medication Sig Start Date End Date Taking? Authorizing Provider  albuterol (VENTOLIN HFA) 108 (90 Base) MCG/ACT inhaler Inhale 1-2 puffs into the lungs every 6 (six) hours as needed for wheezing or shortness of breath. 02/25/23   Radford Pax, NP  azithromycin (ZITHROMAX) 250 MG tablet Take 1 tablet (250 mg total) by mouth daily. Take first 2 tablets together, then 1 every day until finished. 02/25/23   Radford Pax, NP  benzonatate (TESSALON) 200 MG capsule Take 1 capsule (200 mg total) by mouth 3 (three) times daily as needed for cough. 04/07/23   White, Elita Boone, NP  doxycycline (VIBRAMYCIN) 100 MG capsule Take 1 capsule (100 mg total) by mouth 2 (two) times daily. 04/07/23   Valinda Hoar, NP  ibuprofen (ADVIL) 800 MG tablet Take 1 tablet (800 mg total) by mouth every 8 (eight) hours as needed for headache. 02/25/23   Radford Pax, NP  promethazine-dextromethorphan (PROMETHAZINE-DM) 6.25-15 MG/5ML syrup Take 5 mLs by mouth 4 (four) times daily as needed for cough. 04/07/23   Valinda Hoar,  NP      Allergies    Patient has no known allergies.    Review of Systems   Review of Systems  Physical Exam Updated Vital Signs BP 139/75   Pulse (!) 129   Temp 99.1 F (37.3 C) (Oral)   Resp (!) 22   Wt 104.3 kg   LMP 03/31/2023   SpO2 100%   BMI 38.27 kg/m  Physical Exam Vitals and nursing note reviewed.  Constitutional:      General: She is not in acute distress.    Appearance: Normal appearance. She is not ill-appearing.  HENT:     Head: Normocephalic and atraumatic.     Mouth/Throat:     Mouth: Mucous membranes are moist.  Eyes:     General: No scleral icterus.       Right eye: No discharge.        Left eye: No discharge.     Conjunctiva/sclera: Conjunctivae normal.  Pulmonary:     Effort: Pulmonary effort is normal.     Breath sounds: No stridor.  Abdominal:     General: Abdomen is flat.  Skin:    General: Skin is warm and dry.     Comments: Multiple linear lacerations/puncture wounds to volar R wrist.  2 lacerations are gaping in particular.  There is some adipose tissue extruding.  Full wound injury.  Aspiration was conducted and there is laceration/puncture  down to the tendon and the volar wrist ?FCA but no injury  Neurological:     Mental Status: She is alert and oriented to person, place, and time. Mental status is at baseline.  Psychiatric:        Mood and Affect: Mood normal.        Behavior: Behavior normal.       ED Results / Procedures / Treatments   Labs (all labs ordered are listed, but only abnormal results are displayed) Labs Reviewed - No data to display  EKG None  Radiology DG Wrist Complete Right  Result Date: 04/28/2023 CLINICAL DATA:  Recent dog bite EXAM: RIGHT WRIST - COMPLETE 3+ VIEW COMPARISON:  None Available. FINDINGS: Soft tissue irregularity is noted medially and anteriorly consistent with the recent injury. No radiopaque foreign body is noted. No acute bony abnormality is seen. IMPRESSION: Soft tissue injury without  acute bony abnormality. Electronically Signed   By: Alcide Clever M.D.   On: 04/28/2023 00:15    Procedures .Marland KitchenLaceration Repair  Date/Time: 04/28/2023 2:47 AM  Performed by: Gailen Shelter, PA Authorized by: Gailen Shelter, PA   Consent:    Consent obtained:  Verbal   Consent given by:  Parent and patient   Risks discussed:  Infection, pain, poor cosmetic result and poor wound healing Universal protocol:    Procedure explained and questions answered to patient or proxy's satisfaction: yes     Relevant documents present and verified: yes     Test results available: yes     Imaging studies available: yes     Required blood products, implants, devices, and special equipment available: yes     Site/side marked: yes     Immediately prior to procedure, a time out was called: yes     Patient identity confirmed:  Verbally with patient and arm band Laceration details:    Length (cm):  2.8 Exploration:    Hemostasis achieved with:  Epinephrine   Imaging outcome: foreign body not noted     Wound extent: areolar tissue violated     Wound extent: fascia not violated, no foreign body and no signs of injury     Contaminated: no   Treatment:    Amount of cleaning:  Standard   Irrigation solution:  Sterile saline   Irrigation volume:  Copious   Visualized foreign bodies/material removed: no   Skin repair:    Repair method:  Sutures   Suture size:  4-0   Suture material:  Plain gut   Suture technique:  Simple interrupted   Number of sutures:  1 Approximation:    Approximation:  Close Repair type:    Repair type:  Intermediate Post-procedure details:    Dressing:  Antibiotic ointment   Procedure completion:  Tolerated     Medications Ordered in ED Medications  oxyCODONE-acetaminophen (PERCOCET/ROXICET) 5-325 MG per tablet 2 tablet (has no administration in time range)  bupivacaine(PF) (MARCAINE) 0.5 % injection 10 mL (has no administration in time range)  Tdap (BOOSTRIX)  injection 0.5 mL (has no administration in time range)    ED Course/ Medical Decision Making/ A&P                             Medical Decision Making Amount and/or Complexity of Data Reviewed Radiology: ordered.  Risk Prescription drug management.   Patient is a 22 year old female with no pertinent past medical history presenting emergency room today with complaints  of dog bite to wrist/forearm.  Patient was walking to her mother's apartment complex when a dog that lives in her mother's complex he was wearing a collar and is known by the mother ran out and bit the daughter on the wrist. Daughter states she had not med this dog before and it seems it'd escaped from it's owners house.   Bleeding controlled w pressure. She has some paresthesias but can feel touch easily in finger tips. She has pain with movement of her 4th and 5th finger.   She has intact sensation in fingertips.   Xray R wrist unremarkable. No bony issues/no retained foreign body.  I personally viewed xrays.   1 stitch placed for loose wound closure.   TDAP updated.  Percocet   FROM of all fingers, able to make a fist.  Patient does have exposed tendon perhaps flexi carpi ulnaris. FROM however -- appears intact.    I discussed this case with my attending physician who cosigned this note including patient's presenting symptoms, physical exam, and planned diagnostics and interventions. Attending physician stated agreement with plan or made changes to plan which were implemented.   Attending physician assessed patient at bedside.  Placed in splint given exposed tendon.   Shared decision making conversation -- pt would prefer to hold off on empiric treatment of rabies. I think this is reasonable given the situation.   Mother states she will touch base with the dog owner to ensure rabies is UTD Home on augmentin.   Final Clinical Impression(s) / ED Diagnoses Final diagnoses:  Dog bite, initial encounter     Rx / DC Orders ED Discharge Orders          Ordered    amoxicillin-clavulanate (AUGMENTIN) 875-125 MG tablet  Every 12 hours        04/28/23 0146              Gailen Shelter, PA 04/28/23 0440    Gailen Shelter, PA 04/28/23 0440    Zadie Rhine, MD 04/28/23 [phone]

## 2023-04-28 NOTE — Progress Notes (Signed)
Orthopedic Tech Progress Note Patient Details:  Jody King [birth year] [id number]  Ortho Devices Type of Ortho Device: Velcro wrist splint Ortho Device/Splint Location: rue Ortho Device/Splint Interventions: Ordered, Application, Adjustment   Post Interventions Patient Tolerated: Well Instructions Provided: Care of device, Adjustment of device  [name] Post 04/28/2023, 2:54 AM

## 2023-07-01 ENCOUNTER — Encounter (HOSPITAL_COMMUNITY): Payer: Self-pay

## 2023-07-01 ENCOUNTER — Ambulatory Visit (HOSPITAL_COMMUNITY)
Admission: EM | Admit: 2023-07-01 | Discharge: 2023-07-01 | Disposition: A | Discharge status: Home / Self Care | Payer: Medicaid Other | Attending: Physician Assistant | Admitting: Physician Assistant

## 2023-07-01 DIAGNOSIS — Z113 Encounter for screening for infections with a predominantly sexual mode of transmission: Secondary | ICD-10-CM | POA: Diagnosis present

## 2023-07-01 NOTE — ED Triage Notes (Addendum)
Patient presents for STI testing, states no sx, states she had unprotected sex 2 days ago.

## 2023-07-01 NOTE — ED Provider Notes (Signed)
[facility]    CSN: [id number] Arrival date & time: 07/01/23  1525      History   Chief Complaint No chief complaint on file.   HPI Jody King is a 22 y.o. female.   Patient here today for STD screening after unprotected sex 2 days ago.  She denies any vaginal discharge but states she has had some mild pelvic pain.  She is currently on birth control and has no concerns for pregnancy.  She denies any genital lesions or rashes.  The history is provided by the patient.    History reviewed. No pertinent past medical history.  There are no problems to display for this patient.   History reviewed. No pertinent surgical history.  OB History   No obstetric history on file.      Home Medications    Prior to Admission medications   Medication Sig Start Date End Date Taking? Authorizing Provider  albuterol (VENTOLIN HFA) 108 (90 Base) MCG/ACT inhaler Inhale 1-2 puffs into the lungs every 6 (six) hours as needed for wheezing or shortness of breath. 02/25/23   Radford Pax, NP  amoxicillin-clavulanate (AUGMENTIN) 875-125 MG tablet Take 1 tablet by mouth every 12 (twelve) hours. 04/28/23   Gailen Shelter, PA  azithromycin (ZITHROMAX) 250 MG tablet Take 1 tablet (250 mg total) by mouth daily. Take first 2 tablets together, then 1 every day until finished. 02/25/23   Radford Pax, NP  benzonatate (TESSALON) 200 MG capsule Take 1 capsule (200 mg total) by mouth 3 (three) times daily as needed for cough. 04/07/23   White, Elita Boone, NP  doxycycline (VIBRAMYCIN) 100 MG capsule Take 1 capsule (100 mg total) by mouth 2 (two) times daily. 04/07/23   Valinda Hoar, NP  ibuprofen (ADVIL) 800 MG tablet Take 1 tablet (800 mg total) by mouth every 8 (eight) hours as needed for headache. 02/25/23   Radford Pax, NP  promethazine-dextromethorphan (PROMETHAZINE-DM) 6.25-15 MG/5ML syrup Take 5 mLs by mouth 4 (four) times daily as needed for cough. 04/07/23   Valinda Hoar, NP     Family History History reviewed. No pertinent family history.  Social History Social History   Tobacco Use   Smoking status: Never   Smokeless tobacco: Never  Substance Use Topics   Alcohol use: Yes    Comment: occasionally   Drug use: Yes    Types: Marijuana     Allergies   Patient has no known allergies.   Review of Systems Review of Systems  Constitutional:  Negative for chills and fever.  Eyes:  Negative for discharge and redness.  Respiratory:  Negative for shortness of breath.   Gastrointestinal:  Negative for abdominal pain, nausea and vomiting.  Genitourinary:  Negative for genital sores and vaginal discharge.     Physical Exam Triage Vital Signs ED Triage Vitals  Encounter Vitals Group     BP      Systolic BP Percentile      Diastolic BP Percentile      Pulse      Resp      Temp      Temp src      SpO2      Weight      Height      Head Circumference      Peak Flow      Pain Score      Pain Loc      Pain Education      Exclude  from Growth Chart    No data found.  Updated Vital Signs BP 119/65 (BP Location: Left Arm)   Pulse 70   Temp 99.8 F (37.7 C) (Oral)   Resp 20   Ht 5\' 5"  (1.651 m)   Wt 212 lb (96.2 kg)   SpO2 98%   BMI 35.28 kg/m      Physical Exam Vitals and nursing note reviewed.  Constitutional:      General: She is not in acute distress.    Appearance: Normal appearance. She is not ill-appearing.  HENT:     Head: Normocephalic and atraumatic.  Eyes:     Conjunctiva/sclera: Conjunctivae normal.  Cardiovascular:     Rate and Rhythm: Normal rate.  Pulmonary:     Effort: Pulmonary effort is normal. No respiratory distress.  Neurological:     Mental Status: She is alert.  Psychiatric:        Mood and Affect: Mood normal.        Behavior: Behavior normal.        Thought Content: Thought content normal.      UC Treatments / Results  Labs (all labs ordered are listed, but only abnormal results are  displayed) Labs Reviewed  CERVICOVAGINAL ANCILLARY ONLY    EKG   Radiology No results found.  Procedures Procedures (including critical care time)  Medications Ordered in UC Medications - No data to display  Initial Impression / Assessment and Plan / UC Course  I have reviewed the triage vital signs and the nursing notes.  Pertinent labs & imaging results that were available during my care of the patient were reviewed by me and considered in my medical decision making (see chart for details).    STD screening ordered.  Will await results further recommendation.  Encouraged follow-up with any further concerns.  Final Clinical Impressions(s) / UC Diagnoses   Final diagnoses:  Screening for STD (sexually transmitted disease)   Discharge Instructions   None    ED Prescriptions   None    PDMP not reviewed this encounter.   Tomi Bamberger, PA-C 07/01/23 [phone]

## 2023-07-02 LAB — CERVICOVAGINAL ANCILLARY ONLY
Bacterial Vaginitis (gardnerella): NEGATIVE
Candida Glabrata: NEGATIVE
Candida Vaginitis: POSITIVE — AB
Chlamydia: NEGATIVE
Comment: NEGATIVE
Comment: NEGATIVE
Comment: NEGATIVE
Comment: NEGATIVE
Comment: NEGATIVE
Comment: NORMAL
Neisseria Gonorrhea: NEGATIVE
Trichomonas: NEGATIVE

## 2023-07-03 ENCOUNTER — Telehealth (HOSPITAL_COMMUNITY): Payer: Self-pay | Admitting: Emergency Medicine

## 2023-07-03 MED ORDER — FLUCONAZOLE 150 MG PO TABS: 150.0000 mg | ORAL_TABLET | Freq: Once | ORAL | 0 refills | Status: AC

## 2023-08-07 ENCOUNTER — Ambulatory Visit (HOSPITAL_COMMUNITY)
Admission: EM | Admit: 2023-08-07 | Discharge: 2023-08-07 | Disposition: A | Discharge status: Home / Self Care | Payer: Medicaid Other | Attending: Internal Medicine | Admitting: Internal Medicine

## 2023-08-07 ENCOUNTER — Encounter (HOSPITAL_COMMUNITY): Payer: Self-pay | Admitting: Emergency Medicine

## 2023-08-07 DIAGNOSIS — B3731 Acute candidiasis of vulva and vagina: Secondary | ICD-10-CM | POA: Insufficient documentation

## 2023-08-07 DIAGNOSIS — J029 Acute pharyngitis, unspecified: Secondary | ICD-10-CM | POA: Diagnosis present

## 2023-08-07 DIAGNOSIS — Z113 Encounter for screening for infections with a predominantly sexual mode of transmission: Secondary | ICD-10-CM | POA: Insufficient documentation

## 2023-08-07 DIAGNOSIS — N898 Other specified noninflammatory disorders of vagina: Secondary | ICD-10-CM

## 2023-08-07 DIAGNOSIS — Z1152 Encounter for screening for COVID-19: Secondary | ICD-10-CM | POA: Diagnosis not present

## 2023-08-07 DIAGNOSIS — Z975 Presence of (intrauterine) contraceptive device: Secondary | ICD-10-CM | POA: Insufficient documentation

## 2023-08-07 DIAGNOSIS — N760 Acute vaginitis: Secondary | ICD-10-CM | POA: Diagnosis not present

## 2023-08-07 DIAGNOSIS — J069 Acute upper respiratory infection, unspecified: Secondary | ICD-10-CM | POA: Diagnosis present

## 2023-08-07 LAB — POCT URINALYSIS DIP (MANUAL ENTRY)
Glucose, UA: NEGATIVE mg/dL
Nitrite, UA: NEGATIVE
Protein Ur, POC: NEGATIVE mg/dL
Spec Grav, UA: >=1.03 — AB (ref 1.010–1.025)
Urobilinogen, UA: 0.2 E.U./dL
pH, UA: 5.5 (ref 5.0–8.0)

## 2023-08-07 LAB — POCT URINE PREGNANCY: Preg Test, Ur: NEGATIVE

## 2023-08-07 NOTE — ED Triage Notes (Addendum)
Pt present with a cough and sweats that she has had since Saturday. States she has been around sick people.  She is requesting to be tested for strep and also wants to be tested for STDs.  She was seen in Aug for yeast infection and was treat but states symptoms are similar to when she was here before. She is also bleeding after intercourse.

## 2023-08-07 NOTE — Discharge Instructions (Signed)
We have screened you for COVID-19.  We have also screened for yeast, bacterial vaginosis, gonorrhea, trichomoniasis and chlamydia.  Our staff will reach out if your results are positive and require treatment.  Ensure you are drinking at least 64 ounces of water to help flush your kidneys and urinary tract.  You can take 1200 mg of Mucinex daily to help loosen your secretions.  You can also sleep with a humidifier, drink tea with honey and do warm saline gargles.  Alternate between 100 mg of ibuprofen and 500 mg of Tylenol every 4-6 hours for fever, body aches or chills.  Return to clinic for any new or urgent symptoms.

## 2023-08-07 NOTE — ED Provider Notes (Signed)
[facility]    CSN: [id number] Arrival date & time: 08/07/23  1950      History   Chief Complaint Chief Complaint  Patient presents with   SEXUALLY TRANSMITTED DISEASE   Cough    HPI Jody King is a 22 y.o. female.   Patient presents to clinic requesting STI screening for her ongoing abnormal vaginal discharge.  She continues to have intermittent vaginal itching as well as increased vaginal discharge with a sour/fishy odor.  She denies dysuria or flank pain.  Has some mild suprapubic discomfort.  She does have an IUD.  She declines HIV or syphilis screening.  Has been taking mucinex for congestion. Denies fevers, has had some night sweats.    The history is provided by the patient and medical records.  Cough Associated symptoms: diaphoresis and sore throat   Associated symptoms: no shortness of breath and no wheezing     History reviewed. No pertinent past medical history.  There are no problems to display for this patient.   History reviewed. No pertinent surgical history.  OB History   No obstetric history on file.      Home Medications    Prior to Admission medications   Medication Sig Start Date End Date Taking? Authorizing Provider  albuterol (VENTOLIN HFA) 108 (90 Base) MCG/ACT inhaler Inhale 1-2 puffs into the lungs every 6 (six) hours as needed for wheezing or shortness of breath. 02/25/23   Radford Pax, NP  amoxicillin-clavulanate (AUGMENTIN) 875-125 MG tablet Take 1 tablet by mouth every 12 (twelve) hours. 04/28/23   Gailen Shelter, PA  doxycycline (VIBRAMYCIN) 100 MG capsule Take 1 capsule (100 mg total) by mouth 2 (two) times daily. 04/07/23   Valinda Hoar, NP  ibuprofen (ADVIL) 800 MG tablet Take 1 tablet (800 mg total) by mouth every 8 (eight) hours as needed for headache. 02/25/23   Radford Pax, NP    Family History History reviewed. No pertinent family history.  Social History Social History   Tobacco Use   Smoking  status: Never   Smokeless tobacco: Never  Substance Use Topics   Alcohol use: Yes    Comment: occasionally   Drug use: Yes    Types: Marijuana     Allergies   Patient has no known allergies.   Review of Systems Review of Systems  Constitutional:  Positive for diaphoresis.  HENT:  Positive for sore throat.   Respiratory:  Positive for cough. Negative for shortness of breath and wheezing.   Gastrointestinal:  Positive for abdominal pain. Negative for nausea and vomiting.  Genitourinary:  Positive for vaginal discharge. Negative for decreased urine volume, dysuria, flank pain and frequency.  Musculoskeletal:  Negative for back pain.     Physical Exam Triage Vital Signs ED Triage Vitals  Encounter Vitals Group     BP 08/07/23 2005 116/76     Systolic BP Percentile --      Diastolic BP Percentile --      Pulse Rate 08/07/23 2005 73     Resp 08/07/23 2005 16     Temp --      Temp Source 08/07/23 2005 Oral     SpO2 08/07/23 2005 98 %     Weight --      Height --      Head Circumference --      Peak Flow --      Pain Score 08/07/23 2001 0     Pain Loc --  Pain Education --      Exclude from Growth Chart --    No data found.  Updated Vital Signs BP 116/76 (BP Location: Left Arm)   Pulse 73   Resp 16   SpO2 98%   Visual Acuity Right Eye Distance:   Left Eye Distance:   Bilateral Distance:    Right Eye Near:   Left Eye Near:    Bilateral Near:     Physical Exam Vitals and nursing note reviewed.  Constitutional:      Appearance: Normal appearance.  HENT:     Head: Normocephalic and atraumatic.     Right Ear: External ear normal.     Left Ear: External ear normal.     Nose: Nose normal.     Mouth/Throat:     Mouth: Mucous membranes are moist.     Pharynx: Posterior oropharyngeal erythema present.  Eyes:     Conjunctiva/sclera: Conjunctivae normal.  Cardiovascular:     Rate and Rhythm: Normal rate and regular rhythm.     Heart sounds: Normal heart  sounds. No murmur heard. Pulmonary:     Effort: Pulmonary effort is normal. No respiratory distress.     Breath sounds: Normal breath sounds.  Abdominal:     Tenderness: There is no right CVA tenderness or left CVA tenderness.  Musculoskeletal:        General: Normal range of motion.     Cervical back: Normal range of motion.  Skin:    General: Skin is warm and dry.  Neurological:     General: No focal deficit present.     Mental Status: She is alert and oriented to person, place, and time.  Psychiatric:        Mood and Affect: Mood normal.        Behavior: Behavior normal. Behavior is cooperative.      UC Treatments / Results  Labs (all labs ordered are listed, but only abnormal results are displayed) Labs Reviewed  POCT URINALYSIS DIP (MANUAL ENTRY) - Abnormal; Notable for the following components:      Result Value   Clarity, UA cloudy (*)    Bilirubin, UA small (*)    Ketones, POC UA small (15) (*)    Spec Grav, UA >=1.030 (*)    Blood, UA moderate (*)    Leukocytes, UA Large (3+) (*)    All other components within normal limits  URINE CULTURE  SARS CORONAVIRUS 2 (TAT 6-24 HRS)  POCT URINE PREGNANCY  CERVICOVAGINAL ANCILLARY ONLY    EKG   Radiology No results found.  Procedures Procedures (including critical care time)  Medications Ordered in UC Medications - No data to display  Initial Impression / Assessment and Plan / UC Course  I have reviewed the triage vital signs and the nursing notes.  Pertinent labs & imaging results that were available during my care of the patient were reviewed by me and considered in my medical decision making (see chart for details).  Vitals and triage reviewed, patient is hemodynamically stable.  Lungs are vesicular, heart with regular rate and rhythm.  Posterior pharynx with mild erythema, tonsils without exudate or swelling, uvula midline.  Low concern for strep due to other viral URI symptoms such as congestion and cough.   COVID-19 testing obtained.  Symptomatic management for pharyngitis discussed.  Abnormal vaginal discharge, cytology swab obtained.  Reports some suprapubic discomfort.  Urinalysis is cloudy with bilirubin, ketones, high SG, moderate red blood cells and large leukocytes.  Patient does have a IUD, negative urine pregnancy.  Denies dysuria, negative for CVA tenderness.  Culture obtained.  Staff will contact if results require further treatment.  Plan of care, follow-up care and return precautions given, no questions at this time.     Final Clinical Impressions(s) / UC Diagnoses   Final diagnoses:  Viral URI with cough  Vaginal discharge  Acute pharyngitis, unspecified etiology     Discharge Instructions      We have screened you for COVID-19.  We have also screened for yeast, bacterial vaginosis, gonorrhea, trichomoniasis and chlamydia.  Our staff will reach out if your results are positive and require treatment.  Ensure you are drinking at least 64 ounces of water to help flush your kidneys and urinary tract.  You can take 1200 mg of Mucinex daily to help loosen your secretions.  You can also sleep with a humidifier, drink tea with honey and do warm saline gargles.  Alternate between 100 mg of ibuprofen and 500 mg of Tylenol every 4-6 hours for fever, body aches or chills.  Return to clinic for any new or urgent symptoms.      ED Prescriptions   None    PDMP not reviewed this encounter.   Zari Cly, [place], [place] 08/07/23 2030

## 2023-08-08 LAB — CERVICOVAGINAL ANCILLARY ONLY
Bacterial Vaginitis (gardnerella): POSITIVE — AB
Candida Glabrata: NEGATIVE
Candida Vaginitis: POSITIVE — AB
Chlamydia: NEGATIVE
Comment: NEGATIVE
Comment: NEGATIVE
Comment: NEGATIVE
Comment: NEGATIVE
Comment: NEGATIVE
Comment: NORMAL
Neisseria Gonorrhea: NEGATIVE
Trichomonas: NEGATIVE

## 2023-08-08 LAB — SARS CORONAVIRUS 2 (TAT 6-24 HRS): SARS Coronavirus 2: NEGATIVE

## 2023-08-09 ENCOUNTER — Telehealth (HOSPITAL_COMMUNITY): Payer: Self-pay | Admitting: Emergency Medicine

## 2023-08-09 LAB — URINE CULTURE

## 2023-08-09 MED ORDER — FLUCONAZOLE 150 MG PO TABS: ORAL_TABLET | ORAL | 0 refills | Status: DC

## 2023-08-09 MED ORDER — METRONIDAZOLE 500 MG PO TABS: 500.0000 mg | ORAL_TABLET | Freq: Two times a day (BID) | ORAL | 0 refills | Status: DC

## 2023-08-09 NOTE — Telephone Encounter (Signed)
Diflucan sent for yeast and Flagyl sent for BV.  Patient notified via telephone.

## 2023-08-09 NOTE — Progress Notes (Signed)
Patient called, notified of BV and yeast results, medications sent to pharmacy. No questions at this time.

## 2023-10-07 ENCOUNTER — Ambulatory Visit (HOSPITAL_COMMUNITY)
Admission: EM | Admit: 2023-10-07 | Discharge: 2023-10-07 | Disposition: A | Discharge status: Home / Self Care | Payer: Medicaid Other | Attending: Internal Medicine | Admitting: Internal Medicine

## 2023-10-07 ENCOUNTER — Encounter (HOSPITAL_COMMUNITY): Payer: Self-pay | Admitting: Emergency Medicine

## 2023-10-07 DIAGNOSIS — J0100 Acute maxillary sinusitis, unspecified: Secondary | ICD-10-CM | POA: Insufficient documentation

## 2023-10-07 DIAGNOSIS — Z202 Contact with and (suspected) exposure to infections with a predominantly sexual mode of transmission: Secondary | ICD-10-CM | POA: Diagnosis present

## 2023-10-07 MED ORDER — PREDNISONE 10 MG PO TABS: 40.0000 mg | ORAL_TABLET | Freq: Every day | ORAL | 0 refills | Status: DC

## 2023-10-07 MED ORDER — CEFTRIAXONE SODIUM 500 MG IJ SOLR
500.0000 mg | INTRAMUSCULAR | Status: DC
  Administered 2023-10-07: 500 mg via INTRAMUSCULAR

## 2023-10-07 MED ORDER — PROMETHAZINE-DM 6.25-15 MG/5ML PO SYRP: 5.0000 mL | ORAL_SOLUTION | Freq: Four times a day (QID) | ORAL | 0 refills | Status: DC | PRN

## 2023-10-07 MED ORDER — AZITHROMYCIN 250 MG PO TABS: 250.0000 mg | ORAL_TABLET | Freq: Every day | ORAL | 0 refills | Status: DC

## 2023-10-07 MED ORDER — LIDOCAINE HCL (PF) 1 % IJ SOLN
INTRAMUSCULAR | Status: AC
Start: 2023-10-07 — End: ?
  Filled 2023-10-07: qty 2

## 2023-10-07 MED ORDER — CEFTRIAXONE SODIUM 500 MG IJ SOLR
INTRAMUSCULAR | Status: AC
  Filled 2023-10-07: qty 500

## 2023-10-07 NOTE — ED Triage Notes (Signed)
Pt c/o cough, congestion, mucous, difficulty breathing, and sweats for 5 days.   She also wants to be tested for STDs. A recent partner tested positive for gonorrhea.

## 2023-10-07 NOTE — ED Provider Notes (Signed)
[facility]    CSN: [id number] Arrival date & time: 10/07/23  1946      History   Chief Complaint Chief Complaint  Patient presents with   Cough   Nasal Congestion    HPI Jody King is a 22 y.o. female.   22 year old female who presents to urgent care with complaints of cough, congestion, sweats and mucus production.  This has been going on for about 5 days.  She also notes that she has had some nausea in the last couple of days.  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  She has been using Mucinex and DayQuil.  She also had some remaining Promethazine DM that she was taking which helped but she has run out of this.  She denies any urinary symptoms, abdominal pain, vomiting, diarrhea, shortness of breath or chest pain.    She also would like to be tested for STIs today.  Her significant other notified her that he tested positive for gonorrhea and she would also like treatment for this.  She denies any vaginal symptoms at this time.   Cough Associated symptoms: no chest pain, no chills, no ear pain, no fever, no rash, no shortness of breath and no sore throat     History reviewed. No pertinent past medical history.  There are no problems to display for this patient.   History reviewed. No pertinent surgical history.  OB History   No obstetric history on file.      Home Medications    Prior to Admission medications   Medication Sig Start Date End Date Taking? Authorizing Provider  albuterol (VENTOLIN HFA) 108 (90 Base) MCG/ACT inhaler Inhale 1-2 puffs into the lungs every 6 (six) hours as needed for wheezing or shortness of breath. Patient not taking: Reported on 10/07/2023 02/25/23   Radford Pax, NP  amoxicillin-clavulanate (AUGMENTIN) 875-125 MG tablet Take 1 tablet by mouth every 12 (twelve) hours. Patient not taking: Reported on 10/07/2023 04/28/23   Gailen Shelter, PA  doxycycline (VIBRAMYCIN) 100 MG capsule Take 1 capsule  (100 mg total) by mouth 2 (two) times daily. Patient not taking: Reported on 10/07/2023 04/07/23   Valinda Hoar, NP  fluconazole (DIFLUCAN) 150 MG tablet Take 1 tablet today, another in 3 days, and another on your last day of antibiotics. Patient not taking: Reported on 10/07/2023 08/09/23   Garrison, [place] N, FNP  ibuprofen (ADVIL) 800 MG tablet Take 1 tablet (800 mg total) by mouth every 8 (eight) hours as needed for headache. Patient not taking: Reported on 10/07/2023 02/25/23   Radford Pax, NP  metroNIDAZOLE (FLAGYL) 500 MG tablet Take 1 tablet (500 mg total) by mouth 2 (two) times daily. Patient not taking: Reported on 10/07/2023 08/09/23   Garrison, [place] N, FNP    Family History History reviewed. No pertinent family history.  Social History Social History   Tobacco Use   Smoking status: Never   Smokeless tobacco: Never  Substance Use Topics   Alcohol use: Yes    Comment: occasionally   Drug use: Yes    Types: Marijuana     Allergies   Patient has no known allergies.   Review of Systems Review of Systems  Constitutional:  Negative for chills and fever.  HENT:  Positive for congestion, sinus pressure and sinus pain. Negative for ear pain and sore throat.   Eyes:  Negative for pain and visual disturbance.  Respiratory:  Positive for cough. Negative  for shortness of breath.   Cardiovascular:  Negative for chest pain and palpitations.  Gastrointestinal:  Negative for abdominal pain and vomiting.  Genitourinary:  Negative for dysuria and hematuria.  Musculoskeletal:  Negative for arthralgias and back pain.       Muscle aches  Skin:  Negative for color change and rash.  Neurological:  Negative for seizures and syncope.  All other systems reviewed and are negative.    Physical Exam Triage Vital Signs ED Triage Vitals  Encounter Vitals Group     BP --      Systolic BP Percentile --      Diastolic BP Percentile --      Pulse --      Resp --      Temp --       Temp Source 10/07/23 1958 Oral     SpO2 --      Weight --      Height --      Head Circumference --      Peak Flow --      Pain Score 10/07/23 2000 10     Pain Loc --      Pain Education --      Exclude from Growth Chart --    No data found.  Updated Vital Signs LMP 08/22/2023 (Approximate)   Visual Acuity Right Eye Distance:   Left Eye Distance:   Bilateral Distance:    Right Eye Near:   Left Eye Near:    Bilateral Near:     Physical Exam Vitals and nursing note reviewed.  Constitutional:      General: She is not in acute distress.    Appearance: She is well-developed.  HENT:     Head: Normocephalic and atraumatic.  Eyes:     Conjunctiva/sclera: Conjunctivae normal.  Cardiovascular:     Rate and Rhythm: Normal rate and regular rhythm.     Heart sounds: No murmur heard. Pulmonary:     Effort: Pulmonary effort is normal. No respiratory distress.     Breath sounds: Normal breath sounds.  Abdominal:     Palpations: Abdomen is soft.     Tenderness: There is no abdominal tenderness.  Musculoskeletal:        General: No swelling.     Cervical back: Neck supple.  Skin:    General: Skin is warm and dry.     Capillary Refill: Capillary refill takes less than 2 seconds.  Neurological:     Mental Status: She is alert.  Psychiatric:        Mood and Affect: Mood normal.      UC Treatments / Results  Labs (all labs ordered are listed, but only abnormal results are displayed) Labs Reviewed  CERVICOVAGINAL ANCILLARY ONLY    EKG   Radiology No results found.  Procedures Procedures (including critical care time)  Medications Ordered in UC Medications - No data to display  Initial Impression / Assessment and Plan / UC Course  I have reviewed the triage vital signs and the nursing notes.  Pertinent labs & imaging results that were available during my care of the patient were reviewed by me and considered in my medical decision making (see chart for  details).     Possible exposure to STI  Acute non-recurrent maxillary sinusitis   Sinus infection/upper respiratory infection with fevers and cough: Azithromycin 250mg  take 2 tablets tonight, then 1 tablet daily for 4 days Promethazine-DM 5 mLs every 6 hours as needed for cough.  Use caution as this medicine can make you drowsy Prednisone 40mg  every morning for 5 days. Take with food.  Return to urgent care or PCP if symptoms worsen or fail to resolve.    Screening swab done today and results will be available in 24-28 hours. We are treating you for exposure to gonorrhea with an injection of Rocephin. We will contact you if we need to arrange additional treatment based on your testing. Negative results will be on your MyChart account. Abstain from sex until you receive your final results.  Use a condom for sexual encounters. If you have any worsening or changing symptoms including abnormal discharge, pelvic pain, abdominal pain, fever, nausea, or vomiting, then you should be reevaluated.    Final Clinical Impressions(s) / UC Diagnoses   Final diagnoses:  None   Discharge Instructions   None    ED Prescriptions   None    PDMP not reviewed this encounter.   Landis Martins, [place] 10/07/23 2038

## 2023-10-07 NOTE — Discharge Instructions (Addendum)
Sinus infection/upper respiratory infection with fevers and cough: Azithromycin 250mg  take 2 tablets tonight, then 1 tablet daily for 4 days Promethazine-DM 5 mLs every 6 hours as needed for cough. Use caution as this medicine can make you drowsy Prednisone 40mg  every morning for 5 days. Take with food.  Return to urgent care or PCP if symptoms worsen or fail to resolve.    Screening swab done today and results will be available in 24-28 hours. We are treating you for exposure to gonorrhea with an injection of Rocephin. We will contact you if we need to arrange additional treatment based on your testing. Negative results will be on your MyChart account. Abstain from sex until you receive your final results.  Use a condom for sexual encounters. If you have any worsening or changing symptoms including abnormal discharge, pelvic pain, abdominal pain, fever, nausea, or vomiting, then you should be reevaluated.

## 2023-10-08 LAB — CERVICOVAGINAL ANCILLARY ONLY
Bacterial Vaginitis (gardnerella): POSITIVE — AB
Candida Glabrata: NEGATIVE
Candida Vaginitis: POSITIVE — AB
Chlamydia: NEGATIVE
Comment: NEGATIVE
Comment: NEGATIVE
Comment: NEGATIVE
Comment: NEGATIVE
Comment: NEGATIVE
Comment: NORMAL
Neisseria Gonorrhea: POSITIVE — AB
Trichomonas: POSITIVE — AB

## 2023-10-09 ENCOUNTER — Telehealth: Payer: Self-pay

## 2023-10-09 MED ORDER — FLUCONAZOLE 150 MG PO TABS: 150.0000 mg | ORAL_TABLET | Freq: Once | ORAL | 0 refills | Status: AC

## 2023-10-09 MED ORDER — PROMETHAZINE-DM 6.25-15 MG/5ML PO SYRP: 5.0000 mL | ORAL_SOLUTION | Freq: Four times a day (QID) | ORAL | 0 refills | Status: DC | PRN

## 2023-10-09 MED ORDER — AZITHROMYCIN 250 MG PO TABS: 250.0000 mg | ORAL_TABLET | Freq: Every day | ORAL | 0 refills | Status: DC

## 2023-10-09 MED ORDER — PREDNISONE 10 MG PO TABS: 40.0000 mg | ORAL_TABLET | Freq: Every day | ORAL | 0 refills | Status: AC

## 2023-10-09 MED ORDER — METRONIDAZOLE 500 MG PO TABS: 500.0000 mg | ORAL_TABLET | Freq: Two times a day (BID) | ORAL | 0 refills | Status: AC

## 2023-10-09 NOTE — Telephone Encounter (Signed)
Per protocol, pt requires tx with metronidazole and Diflucan. Reviewed with patient, verified pharmacy, prescription sent  Pt requested meds from recent visit be sent to [facility], as she was unable to pick them up at [facility].

## 2023-12-30 ENCOUNTER — Ambulatory Visit
Admission: EM | Admit: 2023-12-30 | Discharge: 2023-12-30 | Disposition: A | Discharge status: Home / Self Care | Payer: Medicaid Other | Attending: Family Medicine | Admitting: Family Medicine

## 2023-12-30 DIAGNOSIS — J0100 Acute maxillary sinusitis, unspecified: Secondary | ICD-10-CM

## 2023-12-30 DIAGNOSIS — N898 Other specified noninflammatory disorders of vagina: Secondary | ICD-10-CM

## 2023-12-30 DIAGNOSIS — Z113 Encounter for screening for infections with a predominantly sexual mode of transmission: Secondary | ICD-10-CM | POA: Diagnosis present

## 2023-12-30 MED ORDER — METRONIDAZOLE 500 MG PO TABS: 500.0000 mg | ORAL_TABLET | Freq: Two times a day (BID) | ORAL | 0 refills | Status: AC

## 2023-12-30 MED ORDER — FLUCONAZOLE 150 MG PO TABS: 150.0000 mg | ORAL_TABLET | Freq: Every day | ORAL | 0 refills | Status: AC

## 2023-12-30 MED ORDER — AMOXICILLIN-POT CLAVULANATE 875-125 MG PO TABS: 1.0000 | ORAL_TABLET | Freq: Two times a day (BID) | ORAL | 0 refills | Status: AC

## 2023-12-30 NOTE — Discharge Instructions (Addendum)
Start Augmentin and metronidazole as prescribed.  Diflucan as prescribed.  The clinic will contact you with results of the testing done today if positive.  Lots of rest and fluids.  Follow-up with your gynecologist if your vaginal symptoms do not improve in your PCP if your sinus symptoms do not improve.  Hope you feel better soon!

## 2023-12-30 NOTE — ED Provider Notes (Addendum)
[facility]    CSN: [id number] Arrival date & time: 12/30/23  1858      History   Chief Complaint Chief Complaint  Patient presents with   Cough   Nasal Congestion   SEXUALLY TRANSMITTED DISEASE    HPI Jody King is a 23 y.o. female presents for vaginal discharge and sinus congestion.  Patient reports 1 week of sinus congestion/pressure pain with purulent nasal discharge.  No fevers or chills.  No asthma history does occasionally smoke marijuana.  In addition she reports some vaginal discharge for a few days that seems consistent with both BV and yeast.  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  She would like STD screening as well but denies any known exposure.  Denies pregnancy or breast-feeding.  No dysuria.  No other concerns at this time.   Cough   History reviewed. No pertinent past medical history.  There are no active problems to display for this patient.   History reviewed. No pertinent surgical history.  OB History   No obstetric history on file.      Home Medications    Prior to Admission medications   Medication Sig Start Date End Date Taking? Authorizing Provider  amoxicillin-clavulanate (AUGMENTIN) 875-125 MG tablet Take 1 tablet by mouth every 12 (twelve) hours. 12/30/23  Yes Radford Pax, NP  fluconazole (DIFLUCAN) 150 MG tablet Take 1 tablet (150 mg total) by mouth daily. Take 1 tablet today and repeat in 3 days if symptoms persist 12/30/23  Yes Radford Pax, NP  metroNIDAZOLE (FLAGYL) 500 MG tablet Take 1 tablet (500 mg total) by mouth 2 (two) times daily. 12/30/23  Yes Radford Pax, NP  albuterol (VENTOLIN HFA) 108 (90 Base) MCG/ACT inhaler Inhale 1-2 puffs into the lungs every 6 (six) hours as needed for wheezing or shortness of breath. Patient not taking: Reported on 10/07/2023 02/25/23   Radford Pax, NP  ibuprofen (ADVIL) 800 MG tablet Take 1 tablet (800 mg total) by mouth every 8 (eight) hours as needed for  headache. Patient not taking: Reported on 10/07/2023 02/25/23   Radford Pax, NP    Family History History reviewed. No pertinent family history.  Social History Social History   Tobacco Use   Smoking status: Never   Smokeless tobacco: Never  Substance Use Topics   Alcohol use: Yes    Comment: occasionally   Drug use: Yes    Types: Marijuana     Allergies   Patient has no known allergies.   Review of Systems Review of Systems  HENT:  Positive for congestion, sinus pressure and sinus pain.   Genitourinary:  Positive for vaginal discharge.     Physical Exam Triage Vital Signs ED Triage Vitals  Encounter Vitals Group     BP 12/30/23 1923 134/84     Systolic BP Percentile --      Diastolic BP Percentile --      Pulse Rate 12/30/23 1923 74     Resp 12/30/23 1923 18     Temp 12/30/23 1923 98.7 F (37.1 C)     Temp Source 12/30/23 1923 Oral     SpO2 12/30/23 1923 98 %     Weight --      Height --      Head Circumference --      Peak Flow --      Pain Score 12/30/23 1921 0     Pain Loc --  Pain Education --      Exclude from Growth Chart --    No data found.  Updated Vital Signs BP 134/84 (BP Location: Right Arm)   Pulse 74   Temp 98.7 F (37.1 C) (Oral)   Resp 18   LMP 12/16/2023 (Approximate)   SpO2 98%   Visual Acuity Right Eye Distance:   Left Eye Distance:   Bilateral Distance:    Right Eye Near:   Left Eye Near:    Bilateral Near:     Physical Exam Vitals and nursing note reviewed.  Constitutional:      General: She is not in acute distress.    Appearance: She is well-developed. She is not ill-appearing.  HENT:     Head: Normocephalic and atraumatic.     Right Ear: Tympanic membrane and ear canal normal.     Left Ear: Tympanic membrane and ear canal normal.     Nose: Congestion present.     Right Turbinates: Not swollen or pale.     Left Turbinates: Swollen and pale.     Right Sinus: No maxillary sinus tenderness or frontal sinus  tenderness.     Left Sinus: Maxillary sinus tenderness present. No frontal sinus tenderness.     Mouth/Throat:     Mouth: Mucous membranes are moist.     Pharynx: Oropharynx is clear. Uvula midline. No posterior oropharyngeal erythema.     Tonsils: No tonsillar exudate or tonsillar abscesses.  Eyes:     Conjunctiva/sclera: Conjunctivae normal.     Pupils: Pupils are equal, round, and reactive to light.  Cardiovascular:     Rate and Rhythm: Normal rate and regular rhythm.     Heart sounds: Normal heart sounds.  Pulmonary:     Effort: Pulmonary effort is normal.     Breath sounds: Normal breath sounds. No wheezing.  Abdominal:     Tenderness: There is no right CVA tenderness or left CVA tenderness.  Musculoskeletal:     Cervical back: Normal range of motion and neck supple.  Lymphadenopathy:     Cervical: No cervical adenopathy.  Skin:    General: Skin is warm and dry.  Neurological:     General: No focal deficit present.     Mental Status: She is alert and oriented to person, place, and time.  Psychiatric:        Mood and Affect: Mood normal.        Behavior: Behavior normal.      UC Treatments / Results  Labs (all labs ordered are listed, but only abnormal results are displayed) Labs Reviewed  CERVICOVAGINAL ANCILLARY ONLY    EKG   Radiology No results found.  Procedures Procedures (including critical care time)  Medications Ordered in UC Medications - No data to display  Initial Impression / Assessment and Plan / UC Course  I have reviewed the triage vital signs and the nursing notes.  Pertinent labs & imaging results that were available during my care of the patient were reviewed by me and considered in my medical decision making (see chart for details).     Reviewed exam and symptoms with patient.  No red flags.  Vaginal swab was ordered we will contact for any positive results.  Will start metronidazole and Diflucan given symptoms consistent with both BV  and yeast per patient.  Will start Augmentin for sinusitis.  Discussed nasal rinses, rest and fluids.  Advised GYN follow-up if her vaginal symptoms do not improve and PCP follow-up if  her sinus symptoms do not improve.  Strict ER precautions reviewed and patient verbalized understanding. Final Clinical Impressions(s) / UC Diagnoses   Final diagnoses:  Vaginal discharge  Screen for STD (sexually transmitted disease)  Acute non-recurrent maxillary sinusitis     Discharge Instructions      Start Augmentin and metronidazole as prescribed.  Diflucan as prescribed.  The clinic will contact you with results of the testing done today if positive.  Lots of rest and fluids.  Follow-up with your gynecologist if your vaginal symptoms do not improve in your PCP if your sinus symptoms do not improve.  Hope you feel better soon!    ED Prescriptions     Medication Sig Dispense Auth. Provider   amoxicillin-clavulanate (AUGMENTIN) 875-125 MG tablet Take 1 tablet by mouth every 12 (twelve) hours. 14 tablet Radford Pax, NP   metroNIDAZOLE (FLAGYL) 500 MG tablet Take 1 tablet (500 mg total) by mouth 2 (two) times daily. 14 tablet Radford Pax, NP   fluconazole (DIFLUCAN) 150 MG tablet Take 1 tablet (150 mg total) by mouth daily. Take 1 tablet today and repeat in 3 days if symptoms persist 2 tablet Radford Pax, NP      PDMP not reviewed this encounter.   Radford Pax, NP 12/30/23 1949    Radford Pax, NP 12/30/23 1949

## 2023-12-30 NOTE — ED Triage Notes (Signed)
Patient presents to UC for cough, nasal congestion since 1 week. Treating symptoms with cough drops, nyquil, dayquil. Also req STD testing, hx of BV/yeast.

## 2023-12-31 LAB — CERVICOVAGINAL ANCILLARY ONLY
Bacterial Vaginitis (gardnerella): POSITIVE — AB
Candida Glabrata: NEGATIVE
Candida Vaginitis: NEGATIVE
Chlamydia: NEGATIVE
Comment: NEGATIVE
Comment: NEGATIVE
Comment: NEGATIVE
Comment: NEGATIVE
Comment: NEGATIVE
Comment: NORMAL
Neisseria Gonorrhea: NEGATIVE
Trichomonas: NEGATIVE

## 2024-01-20 ENCOUNTER — Ambulatory Visit (HOSPITAL_COMMUNITY): Admission: EM | Admit: 2024-01-20 | Discharge: 2024-01-20 | Disposition: A | Discharge status: Home / Self Care

## 2024-01-20 ENCOUNTER — Encounter (HOSPITAL_COMMUNITY): Payer: Self-pay

## 2024-01-20 DIAGNOSIS — B354 Tinea corporis: Secondary | ICD-10-CM | POA: Diagnosis not present

## 2024-01-20 DIAGNOSIS — B349 Viral infection, unspecified: Secondary | ICD-10-CM | POA: Diagnosis present

## 2024-01-20 LAB — POCT RAPID STREP A (OFFICE): Rapid Strep A Screen: NEGATIVE

## 2024-01-20 LAB — POC COVID19/FLU A&B COMBO
Covid Antigen, POC: NEGATIVE
Influenza A Antigen, POC: NEGATIVE
Influenza B Antigen, POC: NEGATIVE

## 2024-01-20 MED ORDER — KETOCONAZOLE 2 % EX SHAM: 1.0000 | MEDICATED_SHAMPOO | CUTANEOUS | 0 refills | Status: AC

## 2024-01-20 MED ORDER — AZELASTINE HCL 0.1 % NA SOLN: 1.0000 | Freq: Two times a day (BID) | NASAL | 1 refills | Status: AC

## 2024-01-20 MED ORDER — CLOTRIMAZOLE-BETAMETHASONE 1-0.05 % EX CREA: 1.0000 | TOPICAL_CREAM | Freq: Two times a day (BID) | CUTANEOUS | 0 refills | Status: AC

## 2024-01-20 MED ORDER — BENZONATATE 200 MG PO CAPS: 200.0000 mg | ORAL_CAPSULE | Freq: Three times a day (TID) | ORAL | 0 refills | Status: AC | PRN

## 2024-01-20 NOTE — ED Provider Notes (Signed)
 [facility]  Note:  This document was prepared using Dragon voice recognition software and may include unintentional dictation errors.  MRN: [id number] DOB: [birth year]  Subjective:   Jody King is a 23 y.o. female presenting for nasal congestion, cough, sore throat x 2 weeks with new onset erythematous papular rash noted throughout body.  Patient reports that rash initially began on abdomen around navel.  Rash is erythematous, pruritic, circular with crusting around the edges with central clearing.  Patient denies that most of the lesions are itchy.  Patient denies using any over-the-counter ointments or creams to treat symptoms.  Patient would like testing for influenza, COVID, strep, syphilis in urgent care.  No current facility-administered medications for this encounter.  Current Outpatient Medications:    azelastine (ASTELIN) 0.1 % nasal spray, Place 1 spray into both nostrils 2 (two) times daily. Use in each nostril as directed, Disp: 30 mL, Rfl: 1   benzonatate (TESSALON) 200 MG capsule, Take 1 capsule (200 mg total) by mouth 3 (three) times daily as needed for cough., Disp: 20 capsule, Rfl: 0   clotrimazole-betamethasone (LOTRISONE) cream, Apply 1 Application topically 2 (two) times daily., Disp: 30 g, Rfl: 0   ketoconazole (NIZORAL) 2 % shampoo, Apply 1 Application topically 2 (two) times a week., Disp: 120 mL, Rfl: 0   albuterol (VENTOLIN HFA) 108 (90 Base) MCG/ACT inhaler, Inhale 1-2 puffs into the lungs every 6 (six) hours as needed for wheezing or shortness of breath. (Patient not taking: Reported on 10/07/2023), Disp: 1 each, Rfl: 0   amoxicillin-clavulanate (AUGMENTIN) 875-125 MG tablet, Take 1 tablet by mouth every 12 (twelve) hours., Disp: 14 tablet, Rfl: 0   fluconazole (DIFLUCAN) 150 MG tablet, Take 1 tablet (150 mg total) by mouth daily. Take 1 tablet today and repeat in 3 days if symptoms persist, Disp: 2 tablet, Rfl: 0   ibuprofen (ADVIL) 800 MG tablet,  Take 1 tablet (800 mg total) by mouth every 8 (eight) hours as needed for headache. (Patient not taking: Reported on 10/07/2023), Disp: 21 tablet, Rfl: 0   metroNIDAZOLE (FLAGYL) 500 MG tablet, Take 1 tablet (500 mg total) by mouth 2 (two) times daily., Disp: 14 tablet, Rfl: 0   No Known Allergies  History reviewed. No pertinent past medical history.   History reviewed. No pertinent surgical history.  History reviewed. No pertinent family history.  Social History   Tobacco Use   Smoking status: Never   Smokeless tobacco: Never  Substance Use Topics   Alcohol use: Yes    Comment: occasionally   Drug use: Yes    Types: Marijuana    ROS Refer to HPI for ROS details.  Objective:   Vitals: BP (!) 118/54 (BP Location: Left Arm)   Pulse 97   Temp 98.4 F (36.9 C) (Oral)   Resp 18   LMP 12/16/2023 (Approximate)   SpO2 98%   Physical Exam Vitals and nursing note reviewed.  Constitutional:      General: She is not in acute distress.    Appearance: She is well-developed. She is not ill-appearing or toxic-appearing.  HENT:     Head: Normocephalic.     Nose: Congestion and rhinorrhea present.     Mouth/Throat:     Mouth: Mucous membranes are moist. No oral lesions.     Pharynx: Oropharynx is clear. Posterior oropharyngeal erythema present. No oropharyngeal exudate.  Eyes:     Conjunctiva/sclera: Conjunctivae normal.  Cardiovascular:     Rate and Rhythm: Normal rate.  Pulmonary:  Effort: Pulmonary effort is normal. No respiratory distress.  Musculoskeletal:        General: No swelling.  Skin:    General: Skin is warm and dry.     Capillary Refill: Capillary refill takes less than 2 seconds.  Neurological:     Mental Status: She is alert.  Psychiatric:        Mood and Affect: Mood normal.        Behavior: Behavior normal.     Procedures  Results for orders placed or performed during the hospital encounter of 01/20/24 (from the past 24 hours)  POC Covid19/Flu  A&B Antigen     Status: Normal   Collection Time: 01/20/24  7:16 PM  Result Value Ref Range   Influenza A Antigen, POC Negative Negative   Influenza B Antigen, POC Negative Negative   Covid Antigen, POC Negative Negative  POC rapid strep A     Status: Normal   Collection Time: 01/20/24  7:16 PM  Result Value Ref Range   Rapid Strep A Screen Negative Negative    Assessment and Plan :   PDMP not reviewed this encounter.  1. Ringworm of body   2. Acute viral syndrome    Ringworm -Prescribed clotrimazole betamethasone cream to be applied twice daily as needed to affected skin for treatment of ring or infection. -Continue to monitor symptoms for worsening severity or change in symptoms.  If any escalation of symptoms follow-up for further evaluation management. -Do your best to limit scratching affected skin as this can cause spread of fungal infection to other areas. -Use ketoconazole 2% shampoo over scalp and when showering on skin to cover larger surface area.  Viral syndrome -Use prescribed azelastine nasal spray twice daily as needed for nasal congestion and postnasal drip. -Use prescribed benzonatate 200 mg capsule 3 times daily as needed for cough suppression. -Serum RPR test collected in UC results should be available in 1 to 2 days if abnormal will be contacted with appropriate guidance -Rapid strep test performed in UC is negative for strep pharyngitis -Rapid COVID/influenza testing performed in UC is negative for COVID and influenza. -Continue to monitor symptoms for worsening severity if you experience any escalation follow-up for further evaluation and management  Ann Maki, NP 01/20/24 1926

## 2024-01-20 NOTE — Discharge Instructions (Addendum)
 Ringworm -Prescribed clotrimazole betamethasone cream to be applied twice daily as needed to affected skin for treatment of ring or infection. -Continue to monitor symptoms for worsening severity or change in symptoms.  If any escalation of symptoms follow-up for further evaluation management. -Do your best to limit scratching affected skin as this can cause spread of fungal infection to other areas.  Viral syndrome -Use prescribed azelastine nasal spray twice daily as needed for nasal congestion and postnasal drip. -Use prescribed benzonatate 200 mg capsule 3 times daily as needed for cough suppression. -Serum RPR test collected in UC results should be available in 1 to 2 days if abnormal will be contacted with appropriate guidance -Rapid strep test performed in UC is negative for strep pharyngitis -Rapid COVID/influenza testing performed in UC is negative for COVID and influenza. -Continue to monitor symptoms for worsening severity if you experience any escalation follow-up for further evaluation and management

## 2024-01-20 NOTE — ED Triage Notes (Signed)
 Cough,congestion and sore throat x 2 weeks. Patient has a rash all over her body.  Home Intervention: None

## 2024-01-21 ENCOUNTER — Encounter (HOSPITAL_COMMUNITY): Payer: Self-pay

## 2024-01-21 LAB — SYPHILIS: RPR W/REFLEX TO RPR TITER AND TREPONEMAL ANTIBODIES, TRADITIONAL SCREENING AND DIAGNOSIS ALGORITHM: RPR Ser Ql: NONREACTIVE
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
